# Patient Record
Sex: Female | Born: 1950 | Hispanic: No | Marital: Single | Smoking: Never smoker
Health system: Southern US, Community
[De-identification: ages and names within clinical notes are randomized; demographics above are authoritative.]

## PROBLEM LIST (undated history)

## (undated) DIAGNOSIS — E039 Hypothyroidism, unspecified: Secondary | ICD-10-CM

## (undated) DIAGNOSIS — M1990 Unspecified osteoarthritis, unspecified site: Secondary | ICD-10-CM

## (undated) DIAGNOSIS — R519 Headache, unspecified: Secondary | ICD-10-CM

## (undated) HISTORY — PX: DIAGNOSTIC LAPAROSCOPY: SUR761

## (undated) HISTORY — PX: ABDOMINAL HYSTERECTOMY: SHX81

---

## 2018-11-28 ENCOUNTER — Other Ambulatory Visit: Payer: Self-pay | Admitting: Podiatry

## 2018-12-20 NOTE — Patient Instructions (Signed)
Angela Mckay  12/20/2018     @PREFPERIOPPHARMACY @   Your procedure is scheduled on  12/27/2018  Report to [facility] at  0700   A.M.  Call this number if you have problems the morning of surgery:  [phone]   Remember:  Do not eat or drink after midnight.                       Take these medicines the morning of surgery with A SIP OF WATER  Euthyrox, imitrex(if needed).    Do not wear jewelry, make-up or nail polish.  Do not wear lotions, powders, or perfumes. Please wear deodorant and brush your teeth.  Do not shave 48 hours prior to surgery.  Men may shave face and neck.  Do not bring valuables to the hospital.  [facility] is not responsible for any belongings or valuables.  Contacts, dentures or bridgework may not be worn into surgery.  Leave your suitcase in the car.  After surgery it may be brought to your room.  For patients admitted to the hospital, discharge time will be determined by your treatment team.  Patients discharged the day of surgery will not be allowed to drive home.   Name and phone number of your driver:   family Special instructions:  None  Please read over the following fact sheets that you were given. Anesthesia Post-op Instructions and Care and Recovery After Surgery       Ostectomy of the Foot, Care After This sheet gives you information about how to care for yourself after your procedure. Your health care provider may also give you more specific instructions. If you have problems or questions, contact your health care provider. What can I expect after the procedure? After the procedure, it is common to have:  Pain.  Swelling.  Stiffness. Follow these instructions at home: If you have a splint or supportive shoe:  Wear the splint or shoe as told by your health care provider. Remove it only as told by your health care provider.  Loosen the splint or shoe if your toes tingle, become numb, or turn cold and blue.  Keep the  splint or shoe clean.  If the splint or shoe is not waterproof: ? Do not let it get wet. ? Cover it with a watertight covering when you take a bath or a shower. If you have a cast:  Do not stick anything inside the cast to scratch your skin. Doing that increases your risk of infection.  Check the skin around the cast every day. Tell your health care provider about any concerns.  You may put lotion on dry skin around the edges of the cast. Do not put lotion on the skin underneath the cast.  Keep the cast clean.  If the cast is not waterproof: ? Do not let it get wet. ? Cover it with a watertight covering when you take a bath or a shower. Bathing  Do not take baths, swim, or use a hot tub until your health care provider approves. Ask your health care provider if you may take showers. You may only be allowed to take sponge baths for bathing.  If your cast or splint is not waterproof, cover it with a watertight covering when you take a bath or a shower.  Keep the bandage (dressing) dry until your health care provider says it can be removed. Incision care   Follow instructions from your  health care provider about how to take care of your incision. Make sure you: ? Wash your hands with soap and water before you change your bandage (dressing). If soap and water are not available, use hand sanitizer. ? Change your dressing as told by your health care provider. ? Leave stitches (sutures), skin glue, or adhesive strips in place. These skin closures may need to stay in place for 2 weeks or longer. If adhesive strip edges start to loosen and curl up, you may trim the loose edges. Do not remove adhesive strips completely unless your health care provider tells you to do that.  Check your incision area every day for signs of infection. Check for: ? Redness, swelling, or pain. ? Fluid or blood. ? Warmth. ? Pus or a bad smell. Managing pain, stiffness, and swelling   If directed, put ice on  the affected area. ? If you have a removable splint or shoe, remove it as told by your health care provider. ? Put ice in a plastic bag. ? Place a towel between your skin and the bag or between your cast and the bag. ? Leave the ice on for 20 minutes, 2-3 times a day.  Move your foot and toes often to avoid stiffness and to lessen swelling.  Raise (elevate) your foot area above the level of your heart while you are sitting or lying down. Driving  Do not drive or use heavy machinery while taking prescription pain medicine.  Ask your health care provider when it is safe to drive if you have a cast, splint, or supportive shoe on your foot. Activity  Return to your normal activities as told by your health care provider. Ask your health care provider what activities are safe for you.  Do exercises as told by your health care provider. Safety  Do not use your foot to support your body weight until your health care provider says that you can.  Use your crutches or walker as told by your health care provider. General instructions  Do not put pressure on any part of the cast or splint until it is fully hardened. This may take several hours.  Do not use any products that contain nicotine or tobacco, such as cigarettes and e-cigarettes. These can delay bone healing. If you need help quitting, ask your health care provider.  Take over-the-counter and prescription medicines only as told by your health care provider.  Keep all follow-up visits as told by your health care provider. This is important. Contact a health care provider if:  You have chills or a fever.  Your pain is not controlled by your pain medicine.  You have redness, swelling, or pain around your incision.  You have fluid or blood coming from your incision.  Your incision feels warm to the touch.  You have pus or a bad smell coming from your incision. Get help right away if:  You have severe pain.  You have new  pain, warmth, and swelling in your leg.  You have chest pain or difficulty breathing. Summary  After the procedure, it is common to have some pain, swelling, and stiffness.  Follow instructions from your health care provider about how to take care of your incision. Check the incision area every day for signs of infection.  Do not use your foot to support your body weight until your health care provider says that you can.  Move your foot and toes often to avoid stiffness and to lessen swelling. This  information is not intended to replace advice given to you by your health care provider. Make sure you discuss any questions you have with your health care provider. Document Released: 06/08/2016 Document Revised: 06/15/2018 Document Reviewed: 06/08/2016 Elsevier Patient Education  2020 [employer] How to Use Chlorhexidine for Bathing Chlorhexidine gluconate (CHG) is a germ-killing (antiseptic) solution that is used to clean the skin. It can get rid of the bacteria that normally live on the skin and can keep them away for about 24 hours. To clean your skin with CHG, you may be given:  A CHG solution to use in the shower or as part of a sponge bath.  A prepackaged cloth that contains CHG. Cleaning your skin with CHG may help lower the risk for infection:  While you are staying in the intensive care unit of the hospital.  If you have a vascular access, such as a central line, to provide short-term or long-term access to your veins.  If you have a catheter to drain urine from your bladder.  If you are on a ventilator. A ventilator is a machine that helps you breathe by moving air in and out of your lungs.  After surgery. What are the risks? Risks of using CHG include:  A skin reaction.  Hearing loss, if CHG gets in your ears.  Eye injury, if CHG gets in your eyes and is not rinsed out.  The CHG product catching fire. Make sure that you avoid smoking and flames after applying CHG  to your skin. Do not use CHG:  If you have a chlorhexidine allergy or have previously reacted to chlorhexidine.  On babies younger than 892 months of age. How to use CHG solution  Use CHG only as told by your health care provider, and follow the instructions on the label.  Use the full amount of CHG as directed. Usually, this is one bottle. During a shower Follow these steps when using CHG solution during a shower (unless your health care provider gives you different instructions): 1. Start the shower. 2. Use your normal soap and shampoo to wash your face and hair. 3. Turn off the shower or move out of the shower stream. 4. Pour the CHG onto a clean washcloth. Do not use any type of brush or rough-edged sponge. 5. Starting at your neck, lather your body down to your toes. Make sure you follow these instructions: ? If you will be having surgery, pay special attention to the part of your body where you will be having surgery. Scrub this area for at least 1 minute. ? Do not use CHG on your head or face. If the solution gets into your ears or eyes, rinse them well with water. ? Avoid your genital area. ? Avoid any areas of skin that have broken skin, cuts, or scrapes. ? Scrub your back and under your arms. Make sure to wash skin folds. 6. Let the lather sit on your skin for 1-2 minutes or as long as told by your health care provider. 7. Thoroughly rinse your entire body in the shower. Make sure that all body creases and crevices are rinsed well. 8. Dry off with a clean towel. Do not put any substances on your body afterward-such as powder, lotion, or perfume-unless you are told to do so by your health care provider. Only use lotions that are recommended by the manufacturer. 9. Put on clean clothes or pajamas. 10. If it is the night before your surgery, sleep in clean  sheets.  During a sponge bath Follow these steps when using CHG solution during a sponge bath (unless your health care provider  gives you different instructions): 1. Use your normal soap and shampoo to wash your face and hair. 2. Pour the CHG onto a clean washcloth. 3. Starting at your neck, lather your body down to your toes. Make sure you follow these instructions: ? If you will be having surgery, pay special attention to the part of your body where you will be having surgery. Scrub this area for at least 1 minute. ? Do not use CHG on your head or face. If the solution gets into your ears or eyes, rinse them well with water. ? Avoid your genital area. ? Avoid any areas of skin that have broken skin, cuts, or scrapes. ? Scrub your back and under your arms. Make sure to wash skin folds. 4. Let the lather sit on your skin for 1-2 minutes or as long as told by your health care provider. 5. Using a different clean, wet washcloth, thoroughly rinse your entire body. Make sure that all body creases and crevices are rinsed well. 6. Dry off with a clean towel. Do not put any substances on your body afterward-such as powder, lotion, or perfume-unless you are told to do so by your health care provider. Only use lotions that are recommended by the manufacturer. 7. Put on clean clothes or pajamas. 8. If it is the night before your surgery, sleep in clean sheets. How to use CHG prepackaged cloths  Only use CHG cloths as told by your health care provider, and follow the instructions on the label.  Use the CHG cloth on clean, dry skin.  Do not use the CHG cloth on your head or face unless your health care provider tells you to.  When washing with the CHG cloth: ? Avoid your genital area. ? Avoid any areas of skin that have broken skin, cuts, or scrapes. Before surgery Follow these steps when using a CHG cloth to clean before surgery (unless your health care provider gives you different instructions): 1. Using the CHG cloth, vigorously scrub the part of your body where you will be having surgery. Scrub using a back-and-forth  motion for 3 minutes. The area on your body should be completely wet with CHG when you are done scrubbing. 2. Do not rinse. Discard the cloth and let the area air-dry. Do not put any substances on the area afterward, such as powder, lotion, or perfume. 3. Put on clean clothes or pajamas. 4. If it is the night before your surgery, sleep in clean sheets.  For general bathing Follow these steps when using CHG cloths for general bathing (unless your health care provider gives you different instructions). 1. Use a separate CHG cloth for each area of your body. Make sure you wash between any folds of skin and between your fingers and toes. Wash your body in the following order, switching to a new cloth after each step: ? The front of your neck, shoulders, and chest. ? Both of your arms, under your arms, and your hands. ? Your stomach and groin area, avoiding the genitals. ? Your right leg and foot. ? Your left leg and foot. ? The back of your neck, your back, and your buttocks. 2. Do not rinse. Discard the cloth and let the area air-dry. Do not put any substances on your body afterward-such as powder, lotion, or perfume-unless you are told to do so by your  health care provider. Only use lotions that are recommended by the manufacturer. 3. Put on clean clothes or pajamas. Contact a health care provider if:  Your skin gets irritated after scrubbing.  You have questions about using your solution or cloth. Get help right away if:  Your eyes become very red or swollen.  Your eyes itch badly.  Your skin itches badly and is red or swollen.  Your hearing changes.  You have trouble seeing.  You have swelling or tingling in your mouth or throat.  You have trouble breathing.  You swallow any chlorhexidine. Summary  Chlorhexidine gluconate (CHG) is a germ-killing (antiseptic) solution that is used to clean the skin. Cleaning your skin with CHG may help to lower your risk for infection.  You  may be given CHG to use for bathing. It may be in a bottle or in a prepackaged cloth to use on your skin. Carefully follow your health care provider's instructions and the instructions on the product label.  Do not use CHG if you have a chlorhexidine allergy.  Contact your health care provider if your skin gets irritated after scrubbing. This information is not intended to replace advice given to you by your health care provider. Make sure you discuss any questions you have with your health care provider. Document Released: 01/12/2012 Document Revised: 07/06/2018 Document Reviewed: 03/17/2017 Elsevier Patient Education  2020 [employer]

## 2018-12-25 ENCOUNTER — Ambulatory Visit (HOSPITAL_COMMUNITY)
Admission: RE | Admit: 2018-12-25 | Discharge: 2018-12-25 | Disposition: A | Discharge status: Home / Self Care | Payer: Medicare Other | Source: Ambulatory Visit | Attending: Podiatry | Admitting: Podiatry

## 2018-12-25 ENCOUNTER — Encounter (HOSPITAL_COMMUNITY)
Admission: RE | Admit: 2018-12-25 | Discharge: 2018-12-25 | Disposition: A | Discharge status: Home / Self Care | Payer: Medicare Other | Source: Ambulatory Visit | Attending: Podiatry | Admitting: Podiatry

## 2018-12-25 ENCOUNTER — Other Ambulatory Visit: Payer: Self-pay

## 2018-12-25 ENCOUNTER — Other Ambulatory Visit (HOSPITAL_COMMUNITY)
Admission: RE | Admit: 2018-12-25 | Discharge: 2018-12-25 | Disposition: A | Discharge status: Home / Self Care | Payer: Medicare Other | Source: Ambulatory Visit | Attending: Podiatry | Admitting: Podiatry

## 2018-12-25 ENCOUNTER — Encounter (HOSPITAL_COMMUNITY): Payer: Self-pay

## 2018-12-25 DIAGNOSIS — M898X7 Other specified disorders of bone, ankle and foot: Secondary | ICD-10-CM | POA: Insufficient documentation

## 2018-12-25 DIAGNOSIS — Z01818 Encounter for other preprocedural examination: Secondary | ICD-10-CM | POA: Insufficient documentation

## 2018-12-25 DIAGNOSIS — Z20828 Contact with and (suspected) exposure to other viral communicable diseases: Secondary | ICD-10-CM | POA: Insufficient documentation

## 2018-12-25 HISTORY — DX: Unspecified osteoarthritis, unspecified site: M19.90

## 2018-12-25 HISTORY — DX: Headache, unspecified: R51.9

## 2018-12-25 HISTORY — DX: Hypothyroidism, unspecified: E03.9

## 2018-12-25 LAB — SARS CORONAVIRUS 2 (TAT 6-24 HRS): SARS Coronavirus 2: NEGATIVE

## 2018-12-27 ENCOUNTER — Other Ambulatory Visit: Payer: Self-pay

## 2018-12-27 ENCOUNTER — Ambulatory Visit (HOSPITAL_COMMUNITY): Payer: Medicare Other | Admitting: Anesthesiology

## 2018-12-27 ENCOUNTER — Ambulatory Visit (HOSPITAL_COMMUNITY): Payer: Medicare Other

## 2018-12-27 ENCOUNTER — Encounter (HOSPITAL_COMMUNITY)
Admission: RE | Disposition: A | Discharge status: Home / Self Care | Payer: Self-pay | Source: Home / Self Care | Attending: Podiatry

## 2018-12-27 ENCOUNTER — Ambulatory Visit (HOSPITAL_COMMUNITY)
Admission: RE | Admit: 2018-12-27 | Discharge: 2018-12-27 | Disposition: A | Discharge status: Home / Self Care | Payer: Medicare Other | Attending: Podiatry | Admitting: Podiatry

## 2018-12-27 ENCOUNTER — Encounter (HOSPITAL_COMMUNITY): Payer: Self-pay | Admitting: Anesthesiology

## 2018-12-27 DIAGNOSIS — E039 Hypothyroidism, unspecified: Secondary | ICD-10-CM | POA: Diagnosis not present

## 2018-12-27 DIAGNOSIS — M357 Hypermobility syndrome: Secondary | ICD-10-CM | POA: Diagnosis not present

## 2018-12-27 DIAGNOSIS — Z79899 Other long term (current) drug therapy: Secondary | ICD-10-CM | POA: Diagnosis not present

## 2018-12-27 DIAGNOSIS — Z79890 Hormone replacement therapy (postmenopausal): Secondary | ICD-10-CM | POA: Insufficient documentation

## 2018-12-27 DIAGNOSIS — M898X7 Other specified disorders of bone, ankle and foot: Secondary | ICD-10-CM | POA: Diagnosis present

## 2018-12-27 DIAGNOSIS — Z9889 Other specified postprocedural states: Secondary | ICD-10-CM

## 2018-12-27 HISTORY — PX: BONE EXOSTOSIS EXCISION: SHX1249

## 2018-12-27 SURGERY — EXCISION, EXOSTOSIS
Anesthesia: Monitor Anesthesia Care | Site: Foot | Laterality: Right

## 2018-12-27 MED ORDER — LIDOCAINE HCL (CARDIAC) PF 100 MG/5ML IV SOSY
PREFILLED_SYRINGE | INTRAVENOUS | Status: DC | PRN
  Administered 2018-12-27: 20 mg via INTRAVENOUS

## 2018-12-27 MED ORDER — ONDANSETRON HCL 4 MG/2ML IJ SOLN
INTRAMUSCULAR | Status: AC
  Filled 2018-12-27: qty 2

## 2018-12-27 MED ORDER — PROPOFOL 500 MG/50ML IV EMUL
INTRAVENOUS | Status: DC | PRN
  Administered 2018-12-27: 150 ug/kg/min via INTRAVENOUS

## 2018-12-27 MED ORDER — LACTATED RINGERS IV SOLN
Freq: Once | INTRAVENOUS | Status: AC
  Administered 2018-12-27: 08:00:00 via INTRAVENOUS

## 2018-12-27 MED ORDER — CEFAZOLIN SODIUM-DEXTROSE 2-4 GM/100ML-% IV SOLN
2.0000 g | INTRAVENOUS | Status: AC
  Administered 2018-12-27: 2 g via INTRAVENOUS
  Filled 2018-12-27: qty 100

## 2018-12-27 MED ORDER — ONDANSETRON HCL 4 MG/2ML IJ SOLN
INTRAMUSCULAR | Status: DC | PRN
  Administered 2018-12-27: 4 mg via INTRAVENOUS

## 2018-12-27 MED ORDER — MIDAZOLAM HCL 2 MG/2ML IJ SOLN
INTRAMUSCULAR | Status: AC
  Filled 2018-12-27: qty 2

## 2018-12-27 MED ORDER — 0.9 % SODIUM CHLORIDE (POUR BTL) OPTIME
TOPICAL | Status: DC | PRN
  Administered 2018-12-27: 500 mL

## 2018-12-27 MED ORDER — PROPOFOL 10 MG/ML IV BOLUS
INTRAVENOUS | Status: AC
  Filled 2018-12-27: qty 40

## 2018-12-27 MED ORDER — CHLORHEXIDINE GLUCONATE CLOTH 2 % EX PADS: 6.0000 | MEDICATED_PAD | Freq: Once | CUTANEOUS | Status: DC

## 2018-12-27 MED ORDER — DEXAMETHASONE SODIUM PHOSPHATE 10 MG/ML IJ SOLN
INTRAMUSCULAR | Status: AC
  Filled 2018-12-27: qty 1

## 2018-12-27 MED ORDER — LIDOCAINE HCL 1 % IJ SOLN
INTRAMUSCULAR | Status: DC | PRN
  Administered 2018-12-27: 10 mL via INTRAMUSCULAR

## 2018-12-27 MED ORDER — KETAMINE HCL 50 MG/5ML IJ SOSY
PREFILLED_SYRINGE | INTRAMUSCULAR | Status: AC
  Filled 2018-12-27: qty 5

## 2018-12-27 MED ORDER — ONDANSETRON HCL 4 MG/2ML IJ SOLN: 4.0000 mg | Freq: Once | INTRAMUSCULAR | Status: DC | PRN

## 2018-12-27 MED ORDER — PROPOFOL 10 MG/ML IV BOLUS
INTRAVENOUS | Status: DC | PRN
  Administered 2018-12-27: 20 mg via INTRAVENOUS

## 2018-12-27 MED ORDER — BUPIVACAINE HCL (PF) 0.5 % IJ SOLN
INTRAMUSCULAR | Status: DC | PRN
  Administered 2018-12-27: 10 mL

## 2018-12-27 MED ORDER — LIDOCAINE HCL (PF) 1 % IJ SOLN
INTRAMUSCULAR | Status: AC
  Filled 2018-12-27: qty 30

## 2018-12-27 MED ORDER — KETAMINE HCL 10 MG/ML IJ SOLN
INTRAMUSCULAR | Status: DC | PRN
  Administered 2018-12-27: 10 mg via INTRAVENOUS
  Administered 2018-12-27: 5 mg via INTRAVENOUS

## 2018-12-27 MED ORDER — MEPERIDINE HCL 50 MG/ML IJ SOLN: 6.2500 mg | INTRAMUSCULAR | Status: DC | PRN

## 2018-12-27 MED ORDER — FENTANYL CITRATE (PF) 100 MCG/2ML IJ SOLN: 25.0000 ug | INTRAMUSCULAR | Status: DC | PRN

## 2018-12-27 MED ORDER — BUPIVACAINE HCL (PF) 0.5 % IJ SOLN
INTRAMUSCULAR | Status: AC
  Filled 2018-12-27: qty 30

## 2018-12-27 SURGICAL SUPPLY — 41 items
BANDAGE ESMARK 4X12 BL STRL LF (DISPOSABLE) ×1 IMPLANT
BENZOIN TINCTURE PRP APPL 2/3 (GAUZE/BANDAGES/DRESSINGS) ×3 IMPLANT
BLADE AVERAGE 25MMX9MM (BLADE) ×1
BLADE AVERAGE 25X9 (BLADE) ×1 IMPLANT
BLADE SURG 15 STRL LF DISP TIS (BLADE) ×1 IMPLANT
BLADE SURG 15 STRL SS (BLADE) ×2
BNDG CONFORM 2 STRL LF (GAUZE/BANDAGES/DRESSINGS) ×3 IMPLANT
BNDG ELASTIC 4X5.8 VLCR NS LF (GAUZE/BANDAGES/DRESSINGS) ×3 IMPLANT
BNDG ESMARK 4X12 BLUE STRL LF (DISPOSABLE) ×3
BNDG GAUZE ELAST 4 BULKY (GAUZE/BANDAGES/DRESSINGS) ×3 IMPLANT
CHLORAPREP W/TINT 26 (MISCELLANEOUS) ×3 IMPLANT
CLOSURE WOUND 1/2 X4 (GAUZE/BANDAGES/DRESSINGS) ×1
CLOTH BEACON ORANGE TIMEOUT ST (SAFETY) ×3 IMPLANT
COVER LIGHT HANDLE STERIS (MISCELLANEOUS) ×6 IMPLANT
COVER WAND RF STERILE (DRAPES) ×2 IMPLANT
CUFF TOURN SGL QUICK 18X4 (TOURNIQUET CUFF) ×2 IMPLANT
DRAPE OEC MINIVIEW 54X84 (DRAPES) ×2 IMPLANT
DRSG ADAPTIC 3X8 NADH LF (GAUZE/BANDAGES/DRESSINGS) ×3 IMPLANT
ELECT REM PT RETURN 9FT ADLT (ELECTROSURGICAL) ×3
ELECTRODE REM PT RTRN 9FT ADLT (ELECTROSURGICAL) ×1 IMPLANT
GAUZE SPONGE 4X4 12PLY STRL (GAUZE/BANDAGES/DRESSINGS) ×3 IMPLANT
GLOVE BIO SURGEON STRL SZ7.5 (GLOVE) ×3 IMPLANT
GLOVE BIOGEL PI IND STRL 7.0 (GLOVE) ×1 IMPLANT
GLOVE BIOGEL PI IND STRL 7.5 (GLOVE) ×2 IMPLANT
GLOVE BIOGEL PI INDICATOR 7.0 (GLOVE) ×2
GLOVE BIOGEL PI INDICATOR 7.5 (GLOVE) ×2
GLOVE ECLIPSE 7.0 STRL STRAW (GLOVE) ×6 IMPLANT
GOWN STRL REUS W/TWL LRG LVL3 (GOWN DISPOSABLE) ×7 IMPLANT
KIT TURNOVER KIT A (KITS) ×3 IMPLANT
MANIFOLD NEPTUNE II (INSTRUMENTS) ×3 IMPLANT
NDL HYPO 25X1 1.5 SAFETY (NEEDLE) ×2 IMPLANT
NEEDLE HYPO 25X1 1.5 SAFETY (NEEDLE) ×9 IMPLANT
NS IRRIG 1000ML POUR BTL (IV SOLUTION) ×3 IMPLANT
PACK BASIC LIMB (CUSTOM PROCEDURE TRAY) ×3 IMPLANT
PAD ARMBOARD 7.5X6 YLW CONV (MISCELLANEOUS) ×3 IMPLANT
RASP SM TEAR CROSS CUT (RASP) ×3 IMPLANT
SET BASIN LINEN APH (SET/KITS/TRAYS/PACK) ×3 IMPLANT
STRIP CLOSURE SKIN 1/2X4 (GAUZE/BANDAGES/DRESSINGS) ×3 IMPLANT
SUT PROLENE 4 0 PS 2 18 (SUTURE) ×2 IMPLANT
SUT VIC AB 4-0 PS2 27 (SUTURE) ×3 IMPLANT
SYR CONTROL 10ML LL (SYRINGE) ×4 IMPLANT

## 2018-12-27 NOTE — Anesthesia Procedure Notes (Signed)
Procedure Name: MAC Date/Time: 12/27/2018 8:25 AM Performed by: Andree Elk Amy A, CRNA Pre-anesthesia Checklist: Patient identified, Emergency Drugs available, Suction available, Timeout performed and Patient being monitored Patient Re-evaluated:Patient Re-evaluated prior to induction Oxygen Delivery Method: Non-rebreather mask

## 2018-12-27 NOTE — Anesthesia Postprocedure Evaluation (Signed)
Anesthesia Post Note  Patient: Angela Mckay  Procedure(s) Performed: REMOVAL EXOSTOSIS RIGHT FOOT (Right Foot)  Patient location during evaluation: PACU Anesthesia Type: MAC Level of consciousness: awake and alert and oriented Pain management: pain level controlled Vital Signs Assessment: post-procedure vital signs reviewed and stable Respiratory status: spontaneous breathing Cardiovascular status: stable Postop Assessment: no apparent nausea or vomiting Anesthetic complications: no     Last Vitals:  Vitals:   12/27/18 0745  BP: (!) 157/91  Temp: 36.9 C  SpO2: 99%    Last Pain:  Vitals:   12/27/18 0745  PainSc: 0-No pain                 [name] A

## 2018-12-27 NOTE — Brief Op Note (Signed)
12/27/2018  9:19 AM  PATIENT:  Angela Mckay  68 y.o. female  PRE-OPERATIVE DIAGNOSIS:  exostosis right foot  POST-OPERATIVE DIAGNOSIS:  exostosis right foot  PROCEDURE:  Procedure(s): REMOVAL EXOSTOSIS RIGHT FOOT (Right)  SURGEON:  Surgeon(s) and Role:    Posey Pronto, Rocket Gunderson, DPM - Primary    ASSISTANTS: none   ANESTHESIA:   local and MAC  EBL:  5 mL   BLOOD ADMINISTERED:none  DRAINS: none   LOCAL MEDICATIONS USED:  MARCAINE   , LIDOCAINE  and Amount: 10 ml pre op  SPECIMEN:  No Specimen  DISPOSITION OF SPECIMEN:  N/A  COUNTS:  YES  TOURNIQUET:   Total Tourniquet Time Documented: Calf (Right) - 29 minutes Total: Calf (Right) - 29 minutes   DICTATION: .Viviann Spare Dictation  PLAN OF CARE: Discharge to home after PACU  PATIENT DISPOSITION:  PACU - hemodynamically stable.   Delay start of Pharmacological VTE agent (>24hrs) due to surgical blood loss or risk of bleeding: not applicable

## 2018-12-27 NOTE — Op Note (Signed)
12/27/2018  9:19 AM  PATIENT:  Angela Mckay  68 y.o. female  PRE-OPERATIVE DIAGNOSIS:  exostosis right foot  POST-OPERATIVE DIAGNOSIS:  exostosis right foot  PROCEDURE:  Procedure(s): REMOVAL EXOSTOSIS RIGHT FOOT (Right)  SURGEON:  Surgeon(s) and Role:    * Miriya Cloer, Dance movement psychotherapist, DPM - Primary   ASSISTANTS: none   ANESTHESIA:   local and MAC  EBL:  5 mL   LOCAL MEDICATIONS USED:  MARCAINE   , LIDOCAINE  and Amount: 10 ml pre op  SPECIMEN:  No Specimen   TOURNIQUET:   Total Tourniquet Time Documented: Calf (Right) - 29 minutes Total: Calf (Right) - 29 minutes  Patient was brought into the operating room laid supine on the operating table. Ankle tourniquet was applied to the surgical extremity. Following IV sedation, a local block was achieved using 10 cc of mixture of 1% plain lidocaine with 0.5% marcaine. The foot was the prepped, scrubbed and draped in aseptic manner. Using an esmarch band the tourniquet on the surgical site was inflatted at 249mHG.   Attention was directed toward the dorsal aspect of the right foot where a bony prominence noted at the base of the first met cuneiform joint. A 5cm dorsal linear incision was made over the bump down to skin and subcutaneous tissue. All the bleeders were ligated. Neurovascular bundle was retrated. Deep fascia was visible at this time and was incised to access the bony bump. A sagittal saw was used to remove the exostosis. Power was used to rasp down any sharp edges and more bone was shaved down from the cuneiform and 1st met base. Wound was irritated with normal saline. Deep fascia was closed using 4-0 Vicryl. Subcutaneous layer was closed using 4-0 vicryl. Skin was closed using 4-0 Prolene. Additional 170mof plain 0.5% marcaine was injected in surgical site. Dry sterile dressing was applied. Tourniquet was deflated and capillary refill time was brisk. Patient will be partial weightbearing with surgical shoe.

## 2018-12-27 NOTE — Transfer of Care (Signed)
Immediate Anesthesia Transfer of Care Note  Patient: Angela Mckay  Procedure(s) Performed: REMOVAL EXOSTOSIS RIGHT FOOT (Right Foot)  Patient Location: PACU  Anesthesia Type:MAC  Level of Consciousness: awake, alert , oriented and patient cooperative  Airway & Oxygen Therapy: Patient Spontanous Breathing  Post-op Assessment: Report given to RN and Post -op Vital signs reviewed and stable  Post vital signs: Reviewed and stable  Last Vitals:  Vitals Value Taken Time  BP    Temp    Pulse    Resp    SpO2      Last Pain:  Vitals:   12/27/18 0745  PainSc: 0-No pain         Complications: No apparent anesthesia complications

## 2018-12-27 NOTE — H&P (Signed)
.   HISTORY AND PHYSICAL INTERVAL NOTE:  12/27/2018  8:10 AM  Angela Mckay  has presented today for surgery, with the diagnosis of exostosis right foot.  The various methods of treatment have been discussed with the patient.  No guarantees were given.  After consideration of risks, benefits and other options for treatment, the patient has consented to surgery.  I have reviewed the patients' chart and labs.    Patient Vitals for the past 24 hrs:  BP Temp SpO2  12/27/18 0745 (!) 157/91 98.4 F (36.9 C) 99 %    A history and physical examination was performed in my office.  The patient was reexamined.  There have been no changes to this history and physical examination.  Tyson Babinski, DPM

## 2018-12-27 NOTE — Discharge Instructions (Signed)
General Anesthesia, Adult, Care After °This sheet gives you information about how to care for yourself after your procedure. Your health care provider may also give you more specific instructions. If you have problems or questions, contact your health care provider. °What can I expect after the procedure? °After the procedure, the following side effects are common: °· Pain or discomfort at the IV site. °· Nausea. °· Vomiting. °· Sore throat. °· Trouble concentrating. °· Feeling cold or chills. °· Weak or tired. °· Sleepiness and fatigue. °· Soreness and body aches. These side effects can affect parts of the body that were not involved in surgery. °Follow these instructions at home: ° °For at least 24 hours after the procedure: °· Have a responsible adult stay with you. It is important to have someone help care for you until you are awake and alert. °· Rest as needed. °· Do not: °? Participate in activities in which you could fall or become injured. °? Drive. °? Use heavy machinery. °? Drink alcohol. °? Take sleeping pills or medicines that cause drowsiness. °? Make important decisions or sign legal documents. °? Take care of children on your own. °Eating and drinking °· Follow any instructions from your health care provider about eating or drinking restrictions. °· When you feel hungry, start by eating small amounts of foods that are soft and easy to digest (bland), such as toast. Gradually return to your regular diet. °· Drink enough fluid to keep your urine pale yellow. °· If you vomit, rehydrate by drinking water, juice, or clear broth. °General instructions °· If you have sleep apnea, surgery and certain medicines can increase your risk for breathing problems. Follow instructions from your health care provider about wearing your sleep device: °? Anytime you are sleeping, including during daytime naps. °? While taking prescription pain medicines, sleeping medicines, or medicines that make you drowsy. °· Return to  your normal activities as told by your health care provider. Ask your health care provider what activities are safe for you. °· Take over-the-counter and prescription medicines only as told by your health care provider. °· If you smoke, do not smoke without supervision. °· Keep all follow-up visits as told by your health care provider. This is important. °Contact a health care provider if: °· You have nausea or vomiting that does not get better with medicine. °· You cannot eat or drink without vomiting. °· You have pain that does not get better with medicine. °· You are unable to pass urine. °· You develop a skin rash. °· You have a fever. °· You have redness around your IV site that gets worse. °Get help right away if: °· You have difficulty breathing. °· You have chest pain. °· You have blood in your urine or stool, or you vomit blood. °Summary °· After the procedure, it is common to have a sore throat or nausea. It is also common to feel tired. °· Have a responsible adult stay with you for the first 24 hours after general anesthesia. It is important to have someone help care for you until you are awake and alert. °· When you feel hungry, start by eating small amounts of foods that are soft and easy to digest (bland), such as toast. Gradually return to your regular diet. °· Drink enough fluid to keep your urine pale yellow. °· Return to your normal activities as told by your health care provider. Ask your health care provider what activities are safe for you. °This information is not   intended to replace advice given to you by your health care provider. Make sure you discuss any questions you have with your health care provider. Document Released: 07/26/2000 Document Revised: 04/22/2017 Document Reviewed: 12/03/2016 Elsevier Patient Education  [place]. .These instructions will give you an idea of what to expect after surgery and how to manage issues that may arise before your first post op office  visit.  Pain Management Pain is best managed by staying ahead of it. If pain gets out of control, it is difficult to get it back under control. Local anesthesia that lasts 6-8 hours is used to numb the foot and decrease pain.  For the best pain control, take the pain medication every 4 hours for the first 2 days post op. On the third day pain medication can be taken as needed.   Post Op Nausea Nausea is common after surgery, so it is managed proactively.  If prescribed, use the prescribed nausea medication regularly for the first 2 days post op.  Bandages Do not worry if there is blood on the bandage. What looks like a lot of blood on the bandage is actually a small amount. Blood on the dressing spreads out as it is absorbed by the gauze, the same way a drop of water spreads out on a paper towel.  If the bandages feel wet or dry, stiff and uncomfortable, call the office during office hours and we will schedule a time for you to have the bandage changed.  Unless you are specifically told otherwise, we will do the first bandage change in the office.  Keep your bandage dry. If the bandage becomes wet or soiled, notify the office and we will schedule a time to change the bandage.  Activity It is best to spend most of the first 2 days after surgery lying down with the foot elevated above the level of your heart. You may put weight on your heel while wearing the surgical shoe.   You may only get up to go to the restroom.  Driving Do not drive until you are able to respond in an emergency (i.e. slam on the brakes). This usually occurs after the bone has healed - 6 to 8 weeks.  Call the Office If you have a fever over 101F.  If you have increasing pain after the initial post op pain has settled down.  If you have increasing redness, swelling, or drainage.  If you have any questions or concerns.

## 2018-12-27 NOTE — Anesthesia Preprocedure Evaluation (Signed)
Anesthesia Evaluation  Patient identified by MRN, date of birth, ID band Patient awake    Reviewed: Allergy & Precautions, NPO status , Patient's Chart, lab work & pertinent test results  History of Anesthesia Complications Negative for: history of anesthetic complications  Airway Mallampati: II  TM Distance: >3 FB Neck ROM: Full    Dental no notable dental hx.    Pulmonary    Pulmonary exam normal breath sounds clear to auscultation       Cardiovascular Exercise Tolerance: Good negative cardio ROS Normal cardiovascular exam     Neuro/Psych  Headaches, negative psych ROS   GI/Hepatic negative GI ROS,   Endo/Other  Hypothyroidism   Renal/GU      Musculoskeletal  (+) Arthritis ,   Abdominal   Peds  Hematology   Anesthesia Other Findings   Reproductive/Obstetrics                             Anesthesia Physical Anesthesia Plan  ASA: II  Anesthesia Plan: MAC   Post-op Pain Management:    Induction:   PONV Risk Score and Plan:   Airway Management Planned: Nasal Cannula, Natural Airway and Simple Face Mask  Additional Equipment:   Intra-op Plan:   Post-operative Plan:   Informed Consent: I have reviewed the patients History and Physical, chart, labs and discussed the procedure including the risks, benefits and alternatives for the proposed anesthesia with the patient or authorized representative who has indicated his/her understanding and acceptance.     Dental advisory given  Plan Discussed with: CRNA  Anesthesia Plan Comments:         Anesthesia Quick Evaluation

## 2018-12-29 ENCOUNTER — Encounter (HOSPITAL_COMMUNITY): Payer: Self-pay | Admitting: Podiatry

## 2019-12-26 IMAGING — DX RIGHT FOOT COMPLETE - 3+ VIEW
3 series · 3 of 3 positions shown · non-contrast
Comparison: No recent.

CLINICAL DATA: Exostosis.  Preoperative exam.

EXAM:
RIGHT FOOT COMPLETE - 3+ VIEW

[foot ap]
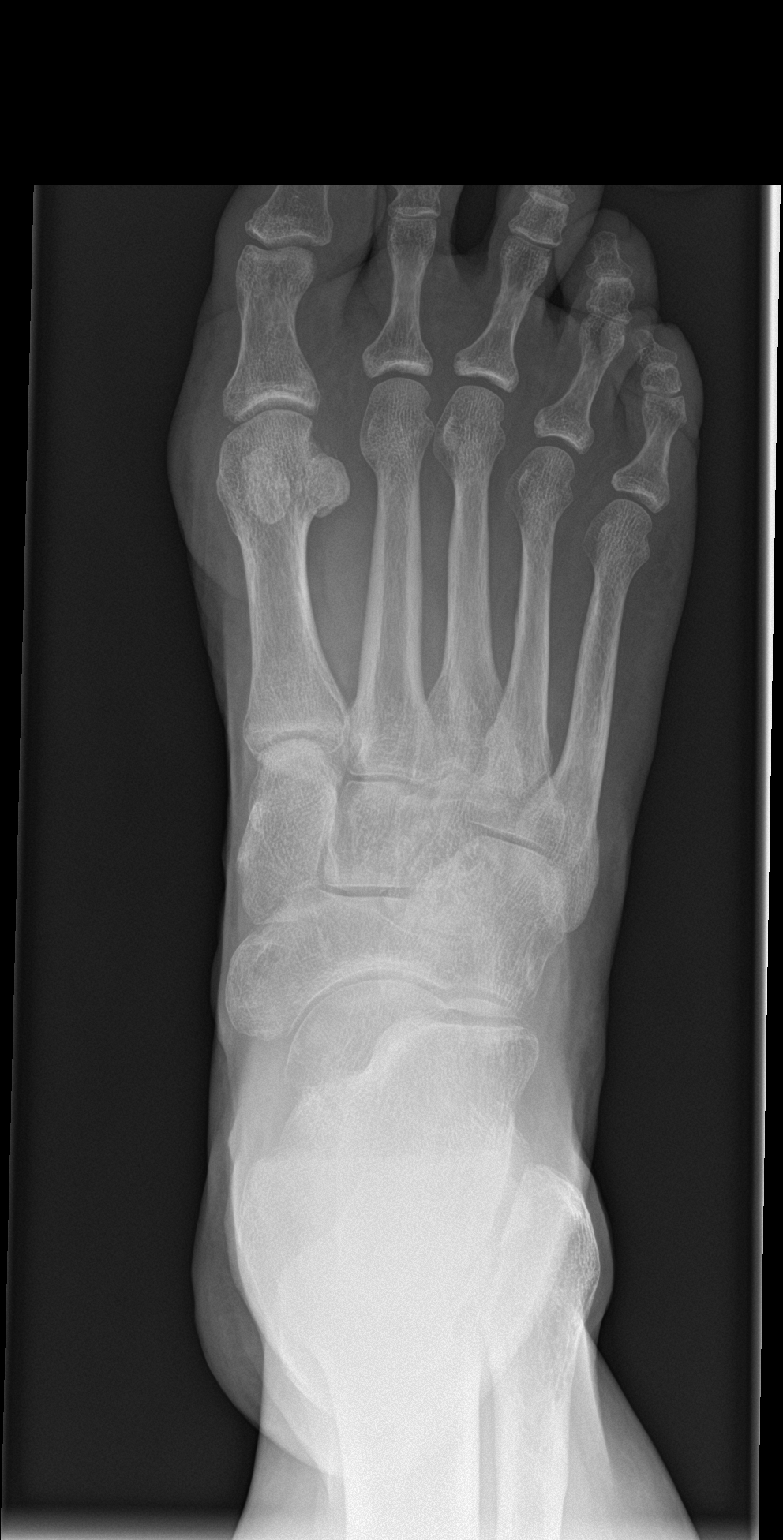

[foot obl]
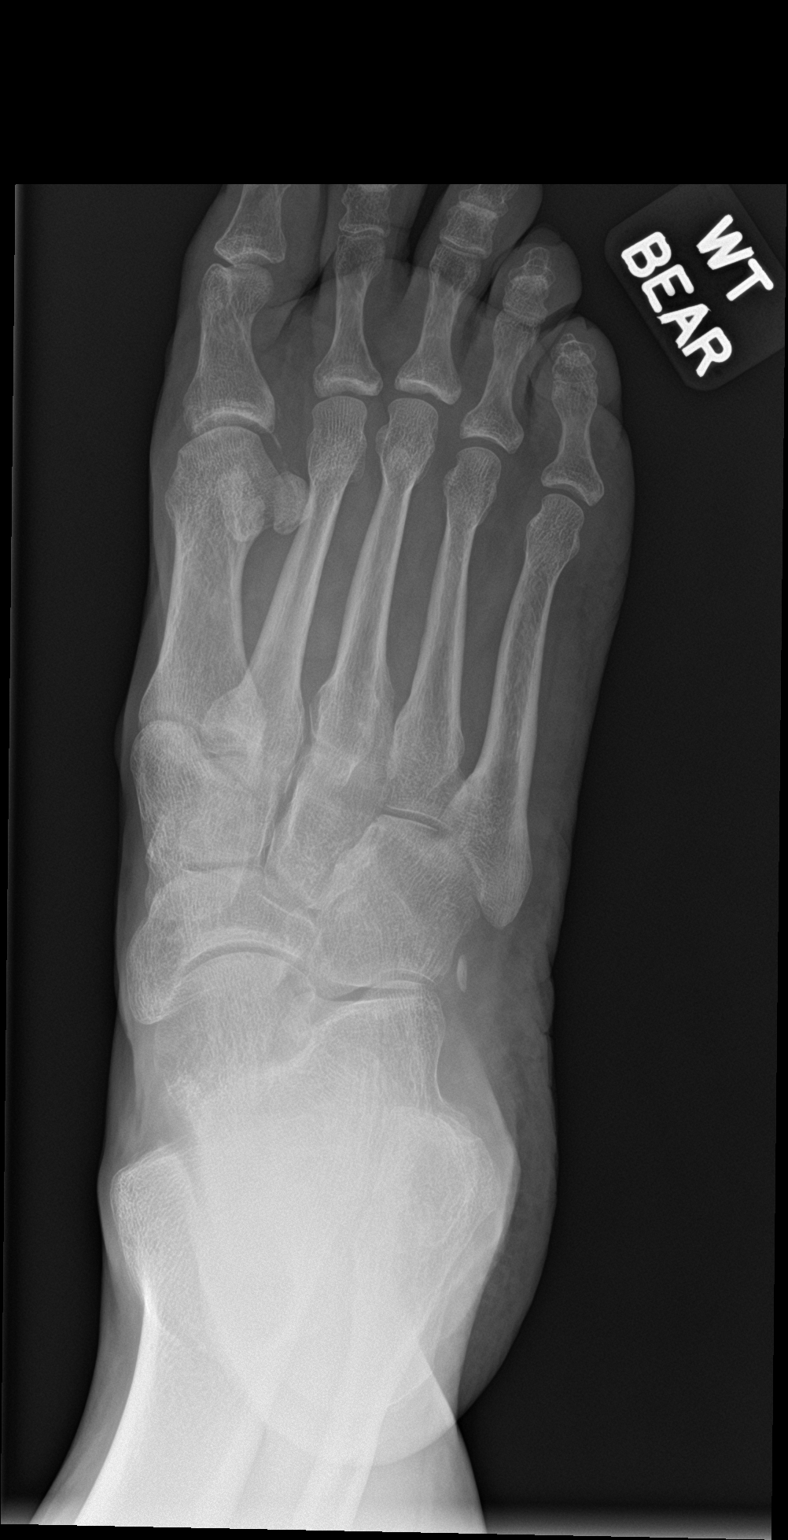

[foot lat]
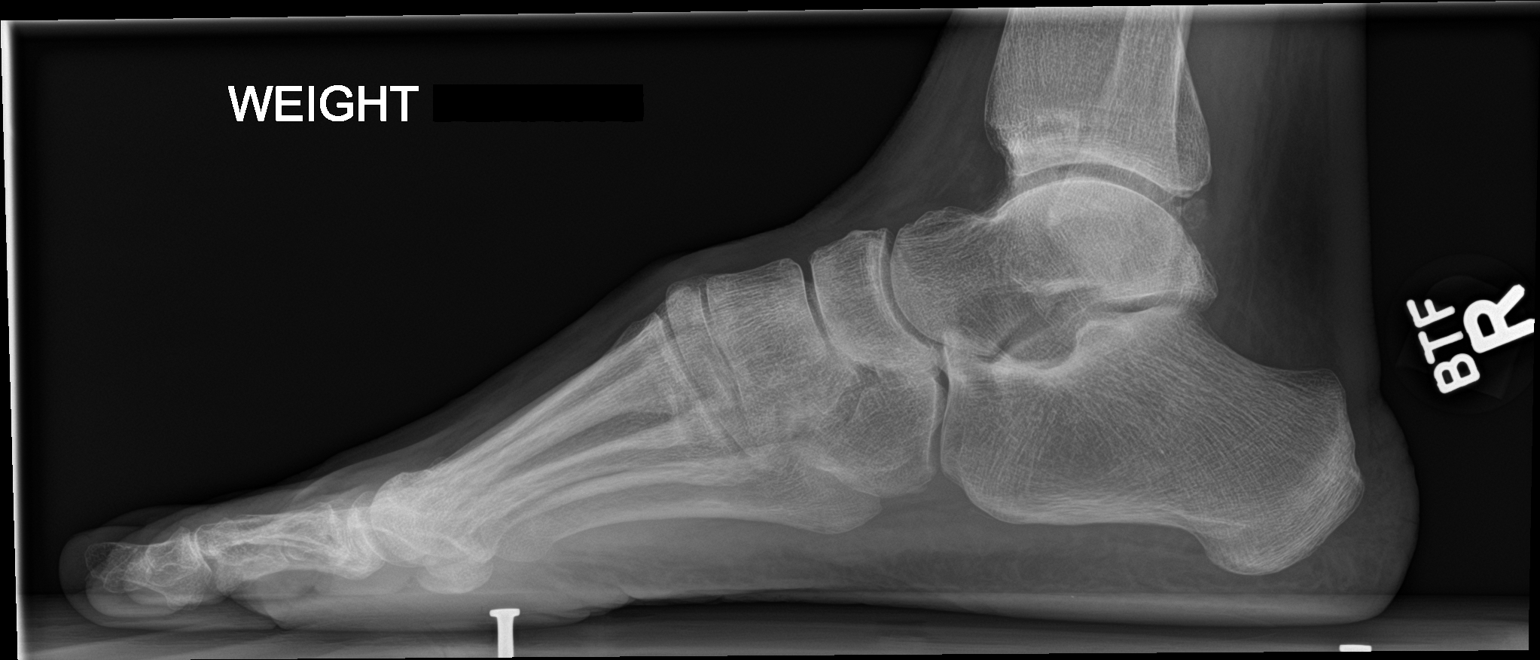

[3 of 3 positions shown; findings below may reference images not displayed]

FINDINGS: Diffuse mild degenerative change. Tiny bony density noted arising
from the lateral base of the right first proximal phalanx. No
evidence of fracture or dislocation. No radiopaque foreign body.
IMPRESSION: Diffuse mild degenerative change. Tiny bony density noted arising
from the lateral aspect of the base of the right first phalanx. No
acute bony abnormality.

## 2020-08-20 ENCOUNTER — Encounter: Payer: Self-pay | Admitting: Internal Medicine

## 2020-09-15 ENCOUNTER — Ambulatory Visit (INDEPENDENT_AMBULATORY_CARE_PROVIDER_SITE_OTHER): Payer: Medicare Other | Admitting: *Deleted

## 2020-09-15 VITALS — Ht 61.0 in | Wt 122.2 lb

## 2020-09-15 DIAGNOSIS — Z86010 Personal history of colonic polyps: Secondary | ICD-10-CM

## 2020-09-15 MED ORDER — PEG 3350-KCL-NA BICARB-NACL 420 G PO SOLR: 4000.0000 mL | Freq: Once | ORAL | 0 refills | Status: AC

## 2020-09-15 NOTE — Progress Notes (Addendum)
Gastroenterology Pre-Procedure Review  Request Date: 09/15/2020 Requesting Physician: Grayland Jack, NP @ [facility], Last TCS 09/25/2014 by Dr. Teena Dunk, tubular adenoma  PATIENT REVIEW QUESTIONS: The patient responded to the following health history questions as indicated:    1. Diabetes Melitis: no 2. Joint replacements in the past 12 months: no 3. Major health problems in the past 3 months: no 4. Has an artificial valve or MVP: no 5. Has a defibrillator: no 6. Has been advised in past to take antibiotics in advance of a procedure like teeth cleaning: no 7. Family history of colon cancer: no 8. Alcohol Use: no 9. Illicit drug Use: no 10. History of sleep apnea: no 11. History of coronary artery or other vascular stents placed within the last 12 months: no 12. History of any prior anesthesia complications: no 13. Body mass index is 23.09 kg/m.    MEDICATIONS & ALLERGIES:    Patient reports the following regarding taking any blood thinners:   Plavix? no Aspirin? no Coumadin? no Brilinta? no Xarelto? no Eliquis? no Pradaxa? no Savaysa? no Effient? no  Patient confirms/reports the following medications:  Current Outpatient Medications  Medication Sig Dispense Refill  . aspirin-acetaminophen-caffeine (EXCEDRIN MIGRAINE) 250-250-65 MG tablet Take by mouth as needed for headache.    . b complex vitamins tablet Take 1 tablet by mouth daily.    . Biotin 10 MG TABS Take 10 mg by mouth daily.    . Cholecalciferol (VITAMIN D3) 125 MCG (5000 UT) CAPS Take 5,000 Units by mouth daily.    Janann August 50 MCG tablet Take 50 mcg by mouth daily before breakfast.     . fexofenadine (ALLEGRA) 180 MG tablet Take 180 mg by mouth daily as needed for allergies or rhinitis.    Marland Kitchen ibuprofen (ADVIL) 200 MG tablet Take 200 mg by mouth as needed. Takes 2 tablets as needed.    . Misc Natural Products (GLUCOSAMINE CHOND MSM FORMULA PO) Take 1 tablet by mouth daily.    .  Misc Natural Products (NEURIVA PO) Take by mouth daily.    . Multiple Vitamins-Minerals (MULTIVITAMIN WITH MINERALS) tablet Take 1 tablet by mouth daily.    . naproxen sodium (ALEVE) 220 MG tablet Take 220-440 mg by mouth as needed (pain).    . OMEGA 3-6-9 FATTY ACIDS PO Take 1 capsule by mouth daily.    . Pseudoephedrine-Ibuprofen (ADVIL COLD/SINUS PO) Take by mouth as needed.    . SUMAtriptan (IMITREX) 100 MG tablet Take 100 mg by mouth as needed for migraine. May repeat in 2 hours if headache persists or recurs.    Marland Kitchen UNABLE TO FIND 1 tablet daily. Med Name: Viviscal     No current facility-administered medications for this visit.    Patient confirms/reports the following allergies:  Allergies  Allergen Reactions  . Codeine Nausea And Vomiting    N/V, headaches     No orders of the defined types were placed in this encounter.   AUTHORIZATION INFORMATION Primary Insurance: Medicare,  ID #: [id number] Pre-Cert / Auth required: No, not required  Secondary Insurance: [employer],  [place] #:[id number] ,  Group #: Plan [id number] Pre-Cert / Auth required: No, not required  SCHEDULE INFORMATION: Procedure has been scheduled as follows:  Date: 10/20/2020, Time: 9:30 Location: [facility] with Dr. Marletta Lor  This Gastroenterology Pre-Precedure Review Form is being routed to the following provider(s): Tana Coast, PA

## 2020-09-15 NOTE — Patient Instructions (Signed)
Covid test: 10/17/2020 at 3:30 PM.   Angela Mckay   [birth year] MRN: [id number] Procedure Date: 10/20/2020 Arrival Time: 8:00 AM     [facility] of [facility]  PREPARATION FOR COLONOSCOPY WITH TRI-LYTE PREP   Please notify us immediately if you are diabetic, take iron supplements, or if you are on Coumadin or any other blood thinners.   Please hold the following medications: n/a  You will need to purchase 1 fleet enema and 1 box of Bisacodyl 5mg  tablets.   PROCEDURE IS SCHEDULED FOR Angela Mckay AS FOLLOWS:  Procedure Date: 10/20/2020 Time to register: 8:00 AM Place to register: 10/22/2020 Short Stay Scheduled provider: Dr. [facility]   2 DAYS BEFORE PROCEDURE:  DATE: 10/18/2020   DAY: Saturday Begin clear liquid diet AFTER your lunch meal. NO SOLID FOODS!   1 DAY BEFORE PROCEDURE:  DATE: 10/19/2020   DAY: Sunday  Continue clear liquids the entire day - NO SOLID FOOD.   Diabetic medications adjustments for today: n/a  At 12:00pm (noon): Take 2 (two) Dulcolax (Bisacodyl) tablets  At 2:00pm: Start drinking your solution. Try to drink 1 (one) 8 ounce glass every 10-15 minutes, until you have consumed HALF the jug. (You should complete the first 1/2 of the jug in 2 hours. Wait 30 minutes, then drink 3-4 more glasses of the solution. Your stools should be clear; if not, you may have to consume the rest of the jug.   One hour after completing the solution: take the last 2 (two) Dulcolax (Bisacodyl) tablets, with a clear liquid.  YOU MUST DRINK PLENTY OF CLEAR LIQUIDS DURING YOUR PREP TO REDUCE RISKS OF KIDNEY FAILURE.   Continue clear liquids only, until 4 hours before your scheduled procedure. Do not eat or drink anything after 5:30 AM on 10/20/2020.  EXCEPTION:  If you take medications for your heart, blood pressure or breathing, you may take these medications with a small amount of clear liquid.      DAY OF PROCEDURE:   DATE: 10/20/2020       DAY: Monday The  morning of your procedure give yourself 1 (one) Fleet Enema, at least 1 hour before going to the hospital.   You may take Tylenol products. Please continue your regular medications unless we have instructed otherwise.   Diabetic medications adjustments for today. n/a  Someone MUST be available to drive you home; the hospital will cancel this appointment if you do not have a driver.   Please call the office if you have any questions (Dept: [phone]).  Please see below for Dietary Information.  CLEAR LIQUIDS INCLUDE:  Water Jello (NOT red in color)   Ice Popsicles (NOT red in color)   Tea (sugar ok, no milk/cream) Powdered fruit flavored drinks  Coffee (sugar ok, no milk/cream) Gatorade/ Lemonade/ Kool-Aid  (NOT red in color)   Juice: apple, white grape, white cranberry Soft drinks  Clear bullion, consomme, broth (fat free beef/chicken/vegetable)  Carbonated beverages (any kind)  Strained chicken noodle soup Hard Candy   REMEMBER: Clear liquids are liquids that will allow you to see your fingers on the other side of a clear glass. Be sure liquids are NOT red in color, and not cloudy, but CLEAR.   DO NOT EAT OR DRINK ANY OF THE FOLLOWING:  Dairy products of any kind   Cranberry juice Tomato juice / V8 juice   Grapefruit juice Orange juice     Red grape juice  Do not eat any solid foods,  including such foods as: cereal, oatmeal, yogurt, fruits, vegetables, creamed soups, eggs, bread, etc.    HELPFUL HINTS FOR DRINKING PREP SOLUTION:   Make sure prep is extremely cold. Refrigerate the night before. You may also put in the freezer.   You may try mixing some Crystal Light or Country Time Lemonade if you prefer. Mix in small amounts; add more if necessary.  Try drinking through a straw  Rinse mouth with water or a mouthwash between glasses, to remove after-taste.  Try sipping on a cold beverage /ice/ popsicles between glasses of prep  Place a piece of sugar-free hard candy  in mouth between glasses  If you become nauseated, try consuming smaller amounts, or stretch out the time between glasses. Stop for 30-60 minutes, then slowly start back drinking    You may call the office (Dept: [phone]) before 5:00pm, or page the doctor on call after 5:00pm ([phone]), for further instructions, if necessary.   OTHER INSTRUCTIONS  You will need a responsible adult at least 69 years of age to accompany you and drive you home. This person must remain in the waiting room during your procedure.  Wear loose fitting clothing that is easily removed.  Leave jewelry and other valuables at home.   Remove all body piercing jewelry and leave at home.  Total time from sign-in until discharge is approximately 2-3 hours.  You should go home directly after your procedure and rest. You can resume normal activities the day after your procedure.  The day of your procedure you should not:  Drive  Make legal decisions  Operate machinery  Drink alcohol  Return to work

## 2020-09-15 NOTE — Progress Notes (Signed)
Ok to schedule.  ASA II 

## 2020-10-17 ENCOUNTER — Inpatient Hospital Stay (HOSPITAL_COMMUNITY): Admission: RE | Admit: 2020-10-17 | Payer: Medicare Other | Source: Ambulatory Visit

## 2020-10-20 ENCOUNTER — Ambulatory Visit (HOSPITAL_COMMUNITY): Payer: Medicare Other | Admitting: Anesthesiology

## 2020-10-20 ENCOUNTER — Other Ambulatory Visit: Payer: Self-pay

## 2020-10-20 ENCOUNTER — Encounter (HOSPITAL_COMMUNITY): Payer: Self-pay

## 2020-10-20 ENCOUNTER — Encounter (HOSPITAL_COMMUNITY)
Admission: RE | Disposition: A | Discharge status: Home / Self Care | Payer: Self-pay | Source: Home / Self Care | Attending: Internal Medicine

## 2020-10-20 ENCOUNTER — Ambulatory Visit (HOSPITAL_COMMUNITY)
Admission: RE | Admit: 2020-10-20 | Discharge: 2020-10-20 | Disposition: A | Discharge status: Home / Self Care | Payer: Medicare Other | Attending: Internal Medicine | Admitting: Internal Medicine

## 2020-10-20 DIAGNOSIS — K5730 Diverticulosis of large intestine without perforation or abscess without bleeding: Secondary | ICD-10-CM | POA: Diagnosis not present

## 2020-10-20 DIAGNOSIS — Z86010 Personal history of colonic polyps: Secondary | ICD-10-CM

## 2020-10-20 DIAGNOSIS — K648 Other hemorrhoids: Secondary | ICD-10-CM | POA: Diagnosis not present

## 2020-10-20 DIAGNOSIS — Z885 Allergy status to narcotic agent status: Secondary | ICD-10-CM | POA: Diagnosis not present

## 2020-10-20 DIAGNOSIS — Z1211 Encounter for screening for malignant neoplasm of colon: Secondary | ICD-10-CM | POA: Insufficient documentation

## 2020-10-20 DIAGNOSIS — Z90710 Acquired absence of both cervix and uterus: Secondary | ICD-10-CM | POA: Diagnosis not present

## 2020-10-20 HISTORY — PX: COLONOSCOPY WITH PROPOFOL: SHX5780

## 2020-10-20 SURGERY — COLONOSCOPY WITH PROPOFOL
Anesthesia: General

## 2020-10-20 MED ORDER — PROPOFOL 10 MG/ML IV BOLUS
INTRAVENOUS | Status: DC | PRN
  Administered 2020-10-20: 100 mg via INTRAVENOUS
  Administered 2020-10-20: 125 ug/kg/min via INTRAVENOUS

## 2020-10-20 MED ORDER — LACTATED RINGERS IV SOLN: INTRAVENOUS | Status: DC

## 2020-10-20 MED ORDER — STERILE WATER FOR IRRIGATION IR SOLN
Status: DC | PRN
  Administered 2020-10-20: 100 mL

## 2020-10-20 NOTE — Anesthesia Preprocedure Evaluation (Addendum)
Anesthesia Evaluation  Patient identified by MRN, date of birth, ID band Patient awake    Reviewed: Allergy & Precautions, NPO status , Patient's Chart, lab work & pertinent test results  History of Anesthesia Complications Negative for: history of anesthetic complications  Airway Mallampati: II  TM Distance: >3 FB Neck ROM: Full   Comment: Nasal bone fx Dental no notable dental hx. (+) Dental Advisory Given, Teeth Intact   Pulmonary neg pulmonary ROS,    Pulmonary exam normal breath sounds clear to auscultation       Cardiovascular Exercise Tolerance: Good negative cardio ROS Normal cardiovascular exam Rhythm:Regular Rate:Normal     Neuro/Psych  Headaches, negative psych ROS   GI/Hepatic negative GI ROS,   Endo/Other  Hypothyroidism   Renal/GU      Musculoskeletal  (+) Arthritis ,   Abdominal   Peds  Hematology   Anesthesia Other Findings Nasal bone fx  Reproductive/Obstetrics                            Anesthesia Physical  Anesthesia Plan  ASA: 2  Anesthesia Plan: General   Post-op Pain Management:    Induction:   PONV Risk Score and Plan:   Airway Management Planned: Nasal Cannula, Natural Airway and Simple Face Mask  Additional Equipment:   Intra-op Plan:   Post-operative Plan:   Informed Consent: I have reviewed the patients History and Physical, chart, labs and discussed the procedure including the risks, benefits and alternatives for the proposed anesthesia with the patient or authorized representative who has indicated his/her understanding and acceptance.     Dental advisory given  Plan Discussed with: CRNA and Surgeon  Anesthesia Plan Comments:         Anesthesia Quick Evaluation

## 2020-10-20 NOTE — Discharge Instructions (Addendum)

## 2020-10-20 NOTE — H&P (Signed)
Primary Care Physician:  Janean Sark, NP Primary Gastroenterologist:  Dr. Marletta Lor  Pre-Procedure History & Physical: HPI:  Angela Mckay is a 70 y.o. female is here for a colonoscopy for colon cancer surveillance purposes due to personal history of polyps. Last TCS 09/25/2014 by Dr. Teena Dunk, tubular adenoma  Past Medical History:  Diagnosis Date   Arthritis    Headache    Hypothyroidism     Past Surgical History:  Procedure Laterality Date   ABDOMINAL HYSTERECTOMY     BONE EXOSTOSIS EXCISION Right 12/27/2018   Procedure: REMOVAL EXOSTOSIS RIGHT FOOT;  Surgeon: Erskine Emery, DPM;  Location: [facility];  Service: Podiatry;  Laterality: Right;   DIAGNOSTIC LAPAROSCOPY      Prior to Admission medications   Medication Sig Start Date End Date Taking? Authorizing Provider  aspirin-acetaminophen-caffeine (EXCEDRIN MIGRAINE) [phone] MG tablet Take 1 tablet by mouth every 6 (six) hours as needed for headache.   Yes [provider]  b complex vitamins tablet Take 1 tablet by mouth daily.   Yes [provider]  Boswellia-Glucosamine-Vit D (OSTEO BI-FLEX ONE PER DAY) TABS Take 1 tablet by mouth daily.   Yes [provider]  Cholecalciferol (VITAMIN D3) 125 MCG (5000 UT) CAPS Take 5,000 Units by mouth daily.   Yes [provider]  EUTHYROX 50 MCG tablet Take 50 mcg by mouth daily before breakfast.  11/03/18  Yes [provider]  fexofenadine (ALLEGRA) 180 MG tablet Take 180 mg by mouth daily as needed for allergies or rhinitis.   Yes [provider]  ibuprofen (ADVIL) 200 MG tablet Take 400 mg by mouth every 6 (six) hours as needed for headache or moderate pain.   Yes [provider]  Misc Natural Products (NEURIVA PO) Take 1 capsule by mouth daily.   Yes [provider]  Multiple Vitamins-Minerals (MULTIVITAMIN WITH MINERALS) tablet Take 1 tablet by mouth daily.   Yes [provider]  naproxen sodium  (ALEVE) 220 MG tablet Take 220 mg by mouth 2 (two) times daily as needed (pain).   Yes [provider]  OMEGA 3-6-9 FATTY ACIDS PO Take 1 capsule by mouth daily.   Yes [provider]  Pseudoephedrine-Ibuprofen (ADVIL COLD/SINUS PO) Take 1 tablet by mouth daily as needed (allergies).   Yes [provider]  Specialty Vitamins Products (BIOTIN PLUS KERATIN PO) Take 1 tablet by mouth daily.   Yes [provider]  UNABLE TO FIND Take 1 tablet by mouth daily. Med Name: Viviscal   Yes [provider]  SUMAtriptan (IMITREX) 100 MG tablet Take 100 mg by mouth as needed for migraine. May repeat in 2 hours if headache persists or recurs.    [provider]    Allergies as of 09/16/2020 - Review Complete 09/15/2020  Allergen Reaction Noted   Codeine Nausea And Vomiting 12/18/2018    History reviewed. No pertinent family history.  Social History   Socioeconomic History   Marital status: Single    Spouse name: Not on file   Number of children: Not on file   Years of education: Not on file   Highest education level: Not on file  Occupational History   Not on file  Tobacco Use   Smoking status: Never   Smokeless tobacco: Never  Vaping Use   Vaping Use: Never used  Substance and Sexual Activity   Alcohol use: Never   Drug use: Never   Sexual activity: Yes    Birth control/protection: Surgical  Other  Topics Concern   Not on file  Social History Narrative   Not on file   Social Determinants of Health   Financial Resource Strain: Not on file  Food Insecurity: Not on file  Transportation Needs: Not on file  Physical Activity: Not on file  Stress: Not on file  Social Connections: Not on file  Intimate Partner Violence: Not on file    Review of Systems: See HPI, otherwise negative ROS  Physical Exam: Vital signs in last 24 hours: Temp:  [97.9 F (36.6 C)] 97.9 F (36.6 C) (06/20 0810) Resp:  [17] 17 (06/20 0810) BP:  (157)/(93) 157/93 (06/20 0810) SpO2:  [99 %] 99 % (06/20 0810) Weight:  [54 kg] 54 kg (06/20 0810)   General:   Alert,  Well-developed, well-nourished, pleasant and cooperative in NAD Head:  Normocephalic and atraumatic. Eyes:  Sclera clear, no icterus.   Conjunctiva pink. Ears:  Normal auditory acuity. Nose:  No deformity, discharge,  or lesions. Mouth:  No deformity or lesions, dentition normal. Neck:  Supple; no masses or thyromegaly. Lungs:  Clear throughout to auscultation.   No wheezes, crackles, or rhonchi. No acute distress. Heart:  Regular rate and rhythm; no murmurs, clicks, rubs,  or gallops. Abdomen:  Soft, nontender and nondistended. No masses, hepatosplenomegaly or hernias noted. Normal bowel sounds, without guarding, and without rebound.   Msk:  Symmetrical without gross deformities. Normal posture. Extremities:  Without clubbing or edema. Neurologic:  Alert and  oriented x4;  grossly normal neurologically. Skin:  Intact without significant lesions or rashes. Cervical Nodes:  No significant cervical adenopathy. Psych:  Alert and cooperative. Normal mood and affect.  Impression/Plan: Angela Mckay is here for a colonoscopy for colon cancer surveillance purposes due to personal history of polyps. Last TCS 09/25/2014 by Dr. Teena Dunk, tubular adenoma  The risks of the procedure including infection, bleed, or perforation as well as benefits, limitations, alternatives and imponderables have been reviewed with the patient. Questions have been answered. All parties agreeable.

## 2020-10-20 NOTE — Op Note (Signed)
[facility] Patient Name: Angela Mckay Procedure Date: 10/20/2020 8:21 AM MRN: [id number] Date of Birth: [birth year] Attending MD: Elon Alas. Abbey Chatters DO CSN: [id number] Age: 70 Admit Type: Outpatient Procedure:                Colonoscopy Indications:              High risk colon cancer surveillance: Personal                            history of colonic polyps Providers:                Elon Alas. Abbey Chatters, DO, Lambert Mody, Dereck Leep, Technician Referring MD:              Medicines:                See the Anesthesia note for documentation of the                            administered medications Complications:            No immediate complications. Estimated Blood Loss:     Estimated blood loss: none. Procedure:                Pre-Anesthesia Assessment:                           - The anesthesia plan was to use monitored                            anesthesia care (MAC).                           After obtaining informed consent, the colonoscope                            was passed under direct vision. Throughout the                            procedure, the patient's blood pressure, pulse, and                            oxygen saturations were monitored continuously. The                            [id number]-[id number] ([id number]) scope was introduced through                            the anus and advanced to the the cecum, identified                            by appendiceal orifice and ileocecal valve. The                            colonoscopy was technically difficult and complex  due to restricted mobility of the colon. The                            patient tolerated the procedure well. The quality                            of the bowel preparation was evaluated using the                            BBPS [facility] Bowel Preparation Scale) with scores                            of: Right Colon = 3, Transverse Colon =  3 and Left                            Colon = 3 (entire mucosa seen well with no residual                            staining, small fragments of stool or opaque                            liquid). The total BBPS score equals 9. Scope In: 8:33:17 AM Scope Out: 8:57:41 AM Scope Withdrawal Time: 0 hours 7 minutes 10 seconds  Total Procedure Duration: 0 hours 24 minutes 24 seconds  Findings:      The perianal and digital rectal examinations were normal.      Non-bleeding internal hemorrhoids were found during endoscopy.      Multiple small and large-mouthed diverticula were found in the sigmoid       colon and descending colon.      The exam was otherwise without abnormality. Impression:               - Non-bleeding internal hemorrhoids.                           - Diverticulosis in the sigmoid colon and in the                            descending colon.                           - The examination was otherwise normal.                           - No specimens collected. Moderate Sedation:      Per Anesthesia Care Recommendation:           - Patient has a contact number available for                            emergencies. The signs and symptoms of potential                            delayed complications were discussed with the  patient. Return to normal activities tomorrow.                            Written discharge instructions were provided to the                            patient.                           - Resume previous diet.                           - Continue present medications.                           - Repeat colonoscopy in 5 years for surveillance.                           - Return to GI clinic PRN. Procedure Code(s):        --- Professional ---                           Z5638, Colorectal cancer screening; colonoscopy on                            individual at high risk Diagnosis Code(s):        --- Professional ---                            Z86.010, Personal history of colonic polyps                           K64.8, Other hemorrhoids                           K57.30, Diverticulosis of large intestine without                            perforation or abscess without bleeding CPT copyright 2019 American Medical Association. All rights reserved. The codes documented in this report are preliminary and upon coder review may  be revised to meet current compliance requirements. Elon Alas. Abbey Chatters, DO [place] Abbey Chatters, DO 10/20/2020 9:00:29 AM This report has been signed electronically. Number of Addenda: 0

## 2020-10-20 NOTE — Transfer of Care (Signed)
Immediate Anesthesia Transfer of Care Note  Patient: Angela Mckay  Procedure(s) Performed: COLONOSCOPY WITH PROPOFOL  Patient Location: Endoscopy Unit  Anesthesia Type:General  Level of Consciousness: awake, oriented, drowsy and patient cooperative  Airway & Oxygen Therapy: Patient Spontanous Breathing  Post-op Assessment: Report given to RN, Post -op Vital signs reviewed and stable and Patient moving all extremities  Post vital signs: Reviewed and stable  Last Vitals:  Vitals Value Taken Time  BP    Temp    Pulse    Resp    SpO2      Last Pain:  Vitals:   10/20/20 0830  TempSrc:   PainSc: 5       Patients Stated Pain Goal: 10 (10/20/20 0810)  Complications: No notable events documented.

## 2020-10-20 NOTE — Anesthesia Postprocedure Evaluation (Signed)
Anesthesia Post Note  Patient: Angela Mckay  Procedure(s) Performed: COLONOSCOPY WITH PROPOFOL  Patient location during evaluation: Endoscopy Anesthesia Type: General Level of consciousness: awake and alert and oriented Pain management: pain level controlled Vital Signs Assessment: post-procedure vital signs reviewed and stable Respiratory status: spontaneous breathing and respiratory function stable Cardiovascular status: blood pressure returned to baseline and stable Postop Assessment: no apparent nausea or vomiting Anesthetic complications: no   No notable events documented.   Last Vitals:  Vitals:   10/20/20 0906 10/20/20 0911  BP: 96/65 105/76  Pulse: 87 80  Resp: 19 15  Temp:    SpO2: 99% 100%    Last Pain:  Vitals:   10/20/20 0911  TempSrc:   PainSc: 0-No pain                 Delonna Ney C Dahlia Nifong

## 2020-10-27 ENCOUNTER — Encounter (HOSPITAL_COMMUNITY): Payer: Self-pay | Admitting: Internal Medicine

## 2020-12-24 ENCOUNTER — Ambulatory Visit (INDEPENDENT_AMBULATORY_CARE_PROVIDER_SITE_OTHER): Payer: Medicare Other | Admitting: Internal Medicine

## 2020-12-24 ENCOUNTER — Encounter: Payer: Self-pay | Admitting: *Deleted

## 2020-12-24 ENCOUNTER — Other Ambulatory Visit: Payer: Self-pay

## 2020-12-24 ENCOUNTER — Encounter: Payer: Self-pay | Admitting: Internal Medicine

## 2020-12-24 VITALS — BP 132/84 | HR 90 | Temp 97.1°F | Ht 61.0 in | Wt 123.0 lb

## 2020-12-24 DIAGNOSIS — R109 Unspecified abdominal pain: Secondary | ICD-10-CM | POA: Diagnosis not present

## 2020-12-24 DIAGNOSIS — R140 Abdominal distension (gaseous): Secondary | ICD-10-CM | POA: Diagnosis not present

## 2020-12-24 NOTE — Progress Notes (Signed)
Referring Provider: Janean Sark Primary Care Physician:  Janean Sark, NP Primary GI:  Dr. Marletta Lor  Chief Complaint  Patient presents with   Follow-up    Cramps after colonoscopy, bloating and gas. When she eats everything stops in her chest.    HPI:   Angela Mckay is a 70 y.o. female who presents to the today for procedure follow-up visit.  Had a colonoscopy 10/20/2020 for surveillance purposes which was relatively unremarkable besides diverticulosis.  She states approximately 1 to 2 weeks after her procedure she had development of abdominal pain.  Primarily epigastric but some lower abdominal pain as well.  Is also noted bloating and gas.  No change in her bowel movements.  No melena hematochezia.  She went to [facility] ER and apparently had a CT scan which is unremarkable per patient though I do not have access to this report.  She is placed on pantoprazole and given Zofran for as needed nausea.  She states she took the pantoprazole for approximately a month and then stopped when the prescription ran out.  She does note it helps some though she does not like taking pills.  No heartburn or acid reflux.  Past Medical History:  Diagnosis Date   Arthritis    Headache    Hypothyroidism     Past Surgical History:  Procedure Laterality Date   ABDOMINAL HYSTERECTOMY     BONE EXOSTOSIS EXCISION Right 12/27/2018   Procedure: REMOVAL EXOSTOSIS RIGHT FOOT;  Surgeon: Erskine Emery, DPM;  Location: [facility];  Service: Podiatry;  Laterality: Right;   COLONOSCOPY WITH PROPOFOL N/A 10/20/2020   Procedure: COLONOSCOPY WITH PROPOFOL;  Surgeon: Lanelle Bal, DO;  Location: [facility] SUITE;  Service: Endoscopy;  Laterality: N/A;  ASA II / 9:30   DIAGNOSTIC LAPAROSCOPY      Current Outpatient Medications  Medication Sig Dispense Refill   aspirin-acetaminophen-caffeine (EXCEDRIN MIGRAINE) 250-250-65 MG tablet Take 1 tablet by mouth every 6 (six) hours as needed for  headache.     b complex vitamins tablet Take 1 tablet by mouth daily.     Boswellia-Glucosamine-Vit D (OSTEO BI-FLEX ONE PER DAY) TABS Take 1 tablet by mouth daily.     Cholecalciferol (VITAMIN D3) 125 MCG (5000 UT) CAPS Take 5,000 Units by mouth daily.     fexofenadine (ALLEGRA) 180 MG tablet Take 180 mg by mouth daily as needed for allergies or rhinitis.     ibuprofen (ADVIL) 200 MG tablet Take 400 mg by mouth every 6 (six) hours as needed for headache or moderate pain.     Misc Natural Products (NEURIVA PO) Take 1 capsule by mouth daily.     Multiple Vitamins-Minerals (MULTIVITAMIN WITH MINERALS) tablet Take 1 tablet by mouth daily.     naproxen sodium (ALEVE) 220 MG tablet Take 220 mg by mouth 2 (two) times daily as needed (pain).     OMEGA 3-6-9 FATTY ACIDS PO Take 1 capsule by mouth daily.     ondansetron (ZOFRAN) 24 MG tablet Take 24 mg by mouth once.     Specialty Vitamins Products (BIOTIN PLUS KERATIN PO) Take 1 tablet by mouth daily.     SUMAtriptan (IMITREX) 100 MG tablet Take 100 mg by mouth as needed for migraine. May repeat in 2 hours if headache persists or recurs.     EUTHYROX 50 MCG tablet Take 50 mcg by mouth daily before breakfast.      Pseudoephedrine-Ibuprofen (ADVIL COLD/SINUS PO) Take 1 tablet by mouth daily as  needed (allergies). (Patient not taking: Reported on 12/24/2020)     UNABLE TO FIND Take 1 tablet by mouth daily. Med Name: Viviscal     No current facility-administered medications for this visit.    Allergies as of 12/24/2020 - Review Complete 10/20/2020  Allergen Reaction Noted   Codeine Nausea And Vomiting 12/18/2018    No family history on file.  Social History   Socioeconomic History   Marital status: Single    Spouse name: Not on file   Number of children: Not on file   Years of education: Not on file   Highest education level: Not on file  Occupational History   Not on file  Tobacco Use   Smoking status: Never   Smokeless tobacco: Never   Vaping Use   Vaping Use: Never used  Substance and Sexual Activity   Alcohol use: Never   Drug use: Never   Sexual activity: Yes    Birth control/protection: Surgical  Other Topics Concern   Not on file  Social History Narrative   Not on file   Social Determinants of Health   Financial Resource Strain: Not on file  Food Insecurity: Not on file  Transportation Needs: Not on file  Physical Activity: Not on file  Stress: Not on file  Social Connections: Not on file    Subjective: Review of Systems  Constitutional:  Negative for chills and fever.  HENT:  Negative for congestion and hearing loss.   Eyes:  Negative for blurred vision and double vision.  Respiratory:  Negative for cough and shortness of breath.   Cardiovascular:  Negative for chest pain and palpitations.  Gastrointestinal:  Positive for abdominal pain. Negative for blood in stool, constipation, diarrhea, heartburn, melena and vomiting.       Bloating  Genitourinary:  Negative for dysuria and urgency.  Musculoskeletal:  Negative for joint pain and myalgias.  Skin:  Negative for itching and rash.  Neurological:  Negative for dizziness and headaches.  Psychiatric/Behavioral:  Negative for depression. The patient is not nervous/anxious.     Objective: BP 132/84   Pulse 90   Temp (!) 97.1 F (36.2 C) (Temporal)   Ht 5\' 1"  (1.549 m)   Wt 123 lb (55.8 kg)   BMI 23.24 kg/m  Physical Exam Constitutional:      Appearance: Normal appearance.  HENT:     Head: Normocephalic and atraumatic.  Eyes:     Extraocular Movements: Extraocular movements intact.     Conjunctiva/sclera: Conjunctivae normal.  Cardiovascular:     Rate and Rhythm: Normal rate and regular rhythm.  Pulmonary:     Effort: Pulmonary effort is normal.     Breath sounds: Normal breath sounds.  Abdominal:     General: Bowel sounds are normal.     Palpations: Abdomen is soft.  Musculoskeletal:        General: No swelling. Normal range of  motion.     Cervical back: Normal range of motion and neck supple.  Skin:    General: Skin is warm and dry.     Coloration: Skin is not jaundiced.  Neurological:     General: No focal deficit present.     Mental Status: She is alert and oriented to person, place, and time.  Psychiatric:        Mood and Affect: Mood normal.        Behavior: Behavior normal.     Assessment: *Abdominal pain *Bloating  Plan: Etiology of patient's symptoms unclear.  Colonoscopy was benign.  We will request records of CT imaging from [facility] ER.  I will order right upper quadrant ultrasound to rule out biliary colic.  She may need an EGD if this is unremarkable to rule out peptic ulcer disease.  Continue on digestive enzymes, recommend she add probiotics as well.  Further recommendations to follow  12/24/2020 3:42 PM   Disclaimer: This note was dictated with voice recognition software. Similar sounding words can inadvertently be transcribed and may not be corrected upon review.

## 2020-12-24 NOTE — Patient Instructions (Signed)
We will schedule you for a right upper quadrant ultrasound to further evaluate your abdominal pain and bloating to rule out gallbladder as potential cause of your problems.  Continue on digestive enzymes.  Would recommend you add probiotics as well.  If your symptoms persist and your ultrasound is unremarkable, we will likely need to proceed with upper endoscopy to rule out potential gastric ulcer/gastritis.  Follow-up in 3 months.  It was great seeing you again today.  Dr. Marletta Lor   At [facility] we value your feedback. You may receive a survey about your visit today. Please share your experience as we strive to create trusting relationships with our patients to provide genuine, compassionate, quality care.  We appreciate your understanding and patience as we review any laboratory studies, imaging, and other diagnostic tests that are ordered as we care for you. Our office policy is 5 business days for review of these results, and any emergent or urgent results are addressed in a timely manner for your best interest. If you do not hear from our office in 1 week, please contact us.   We also encourage the use of MyChart, which contains your medical information for your review as well. If you are not enrolled in this feature, an access code is on this after visit summary for your convenience. Thank you for allowing [place] to be involved in your care.  It was great to see you today!  I hope you have a great rest of your summer!!    Hennie Duos. Marletta Lor, D.O. Gastroenterology and Hepatology [facility]

## 2021-01-02 ENCOUNTER — Ambulatory Visit (HOSPITAL_COMMUNITY): Payer: Medicare Other

## 2021-01-09 ENCOUNTER — Other Ambulatory Visit: Payer: Self-pay

## 2021-01-09 ENCOUNTER — Ambulatory Visit (HOSPITAL_COMMUNITY)
Admission: RE | Admit: 2021-01-09 | Discharge: 2021-01-09 | Disposition: A | Discharge status: Home / Self Care | Payer: Medicare Other | Source: Ambulatory Visit | Attending: Internal Medicine | Admitting: Internal Medicine

## 2021-01-09 DIAGNOSIS — R109 Unspecified abdominal pain: Secondary | ICD-10-CM | POA: Diagnosis not present

## 2021-03-09 ENCOUNTER — Encounter: Payer: Self-pay | Admitting: Internal Medicine

## 2021-04-29 ENCOUNTER — Ambulatory Visit: Payer: Medicare Other | Admitting: Internal Medicine

## 2021-05-21 ENCOUNTER — Ambulatory Visit (INDEPENDENT_AMBULATORY_CARE_PROVIDER_SITE_OTHER): Payer: Medicare Other | Admitting: Internal Medicine

## 2021-05-21 ENCOUNTER — Other Ambulatory Visit: Payer: Self-pay

## 2021-05-21 ENCOUNTER — Encounter: Payer: Self-pay | Admitting: Internal Medicine

## 2021-05-21 VITALS — BP 139/91 | HR 93 | Temp 97.7°F | Ht 61.0 in | Wt 120.0 lb

## 2021-05-21 DIAGNOSIS — K219 Gastro-esophageal reflux disease without esophagitis: Secondary | ICD-10-CM

## 2021-05-21 DIAGNOSIS — R1013 Epigastric pain: Secondary | ICD-10-CM

## 2021-05-21 DIAGNOSIS — R140 Abdominal distension (gaseous): Secondary | ICD-10-CM

## 2021-05-21 DIAGNOSIS — G8929 Other chronic pain: Secondary | ICD-10-CM | POA: Diagnosis not present

## 2021-05-21 NOTE — H&P (View-Only) (Signed)
Referring Provider: Janean Sark Primary Care Physician:  Janean Sark, NP Primary GI:  Dr. Marletta Lor  Chief Complaint  Patient presents with   Nausea    With eating    Abdominal Pain    Cramping, epigastric   Gastroesophageal Reflux    Bending over acid comes back up    HPI:   Angela Mckay is a 71 y.o. female who presents to the today for follow-up visit.  Had a colonoscopy 10/20/2020 for surveillance purposes which was relatively unremarkable besides diverticulosis.  She states approximately 1 to 2 weeks after her procedure she had development of abdominal pain.  Primarily epigastric but some lower abdominal pain as well.  Is also noted bloating and gas.  No change in her bowel movements.  No melena hematochezia.    Also with worsening acid reflux.  Was previously on pantoprazole though stopped taking this as she is not a big fan of taking medications.  No dysphagia or odynophagia.  Underwent right upper quadrant ultrasound which did not show cholelithiasis or cholecystitis.   Past Medical History:  Diagnosis Date   Arthritis    Headache    Hypothyroidism     Past Surgical History:  Procedure Laterality Date   ABDOMINAL HYSTERECTOMY     BONE EXOSTOSIS EXCISION Right 12/27/2018   Procedure: REMOVAL EXOSTOSIS RIGHT FOOT;  Surgeon: Erskine Emery, DPM;  Location: [facility];  Service: Podiatry;  Laterality: Right;   COLONOSCOPY WITH PROPOFOL N/A 10/20/2020   Procedure: COLONOSCOPY WITH PROPOFOL;  Surgeon: Lanelle Bal, DO;  Location: [facility];  Service: Endoscopy;  Laterality: N/A;  ASA II / 9:30   DIAGNOSTIC LAPAROSCOPY      Current Outpatient Medications  Medication Sig Dispense Refill   aspirin-acetaminophen-caffeine (EXCEDRIN MIGRAINE) 250-250-65 MG tablet Take 1 tablet by mouth every 6 (six) hours as needed for headache.     b complex vitamins tablet Take 1 tablet by mouth daily.     Boswellia-Glucosamine-Vit D (OSTEO BI-FLEX ONE PER  DAY) TABS Take 1 tablet by mouth daily.     Cholecalciferol (VITAMIN D3) 125 MCG (5000 UT) CAPS Take 5,000 Units by mouth daily.     Digestive Enzymes (DIGESTIVE ENZYME PO) Take by mouth.     EUTHYROX 50 MCG tablet Take 50 mcg by mouth daily before breakfast.      fexofenadine (ALLEGRA) 180 MG tablet Take 180 mg by mouth daily as needed for allergies or rhinitis.     ibuprofen (ADVIL) 200 MG tablet Take 400 mg by mouth every 6 (six) hours as needed for headache or moderate pain.     Misc Natural Products (NEURIVA PO) Take 1 capsule by mouth daily.     Multiple Vitamins-Minerals (MULTIVITAMIN WITH MINERALS) tablet Take 1 tablet by mouth daily.     naproxen sodium (ALEVE) 220 MG tablet Take 220 mg by mouth 2 (two) times daily as needed (pain).     OMEGA 3-6-9 FATTY ACIDS PO Take 1 capsule by mouth daily.     Pseudoephedrine-Ibuprofen (ADVIL COLD/SINUS PO) Take 1 tablet by mouth daily as needed (allergies).     psyllium (METAMUCIL) 58.6 % packet Take 1 packet by mouth daily.     Specialty Vitamins Products (BIOTIN PLUS KERATIN PO) Take 1 tablet by mouth daily.     SUMAtriptan (IMITREX) 100 MG tablet Take 100 mg by mouth as needed for migraine. May repeat in 2 hours if headache persists or recurs.     UNABLE TO FIND Take  1 tablet by mouth daily. Med Name: Viviscal     ondansetron (ZOFRAN) 24 MG tablet Take 24 mg by mouth once. (Patient not taking: Reported on 05/21/2021)     No current facility-administered medications for this visit.    Allergies as of 05/21/2021 - Review Complete 05/21/2021  Allergen Reaction Noted   Codeine Nausea And Vomiting 12/18/2018    No family history on file.  Social History   Socioeconomic History   Marital status: Single    Spouse name: Not on file   Number of children: Not on file   Years of education: Not on file   Highest education level: Not on file  Occupational History   Not on file  Tobacco Use   Smoking status: Never   Smokeless tobacco: Never   Vaping Use   Vaping Use: Never used  Substance and Sexual Activity   Alcohol use: Never   Drug use: Never   Sexual activity: Yes    Birth control/protection: Surgical  Other Topics Concern   Not on file  Social History Narrative   Not on file   Social Determinants of Health   Financial Resource Strain: Not on file  Food Insecurity: Not on file  Transportation Needs: Not on file  Physical Activity: Not on file  Stress: Not on file  Social Connections: Not on file    Subjective: Review of Systems  Constitutional:  Negative for chills and fever.  HENT:  Negative for congestion and hearing loss.   Eyes:  Negative for blurred vision and double vision.  Respiratory:  Negative for cough and shortness of breath.   Cardiovascular:  Negative for chest pain and palpitations.  Gastrointestinal:  Positive for abdominal pain and heartburn. Negative for blood in stool, constipation, diarrhea, melena and vomiting.       Bloating  Genitourinary:  Negative for dysuria and urgency.  Musculoskeletal:  Negative for joint pain and myalgias.  Skin:  Negative for itching and rash.  Neurological:  Negative for dizziness and headaches.  Psychiatric/Behavioral:  Negative for depression. The patient is not nervous/anxious.     Objective: BP (!) 139/91    Pulse 93    Temp 97.7 F (36.5 C)    Ht 5\' 1"  (1.549 m)    Wt 120 lb (54.4 kg)    BMI 22.67 kg/m  Physical Exam Constitutional:      Appearance: Normal appearance.  HENT:     Head: Normocephalic and atraumatic.  Eyes:     Extraocular Movements: Extraocular movements intact.     Conjunctiva/sclera: Conjunctivae normal.  Cardiovascular:     Rate and Rhythm: Normal rate and regular rhythm.  Pulmonary:     Effort: Pulmonary effort is normal.     Breath sounds: Normal breath sounds.  Abdominal:     General: Bowel sounds are normal.     Palpations: Abdomen is soft.  Musculoskeletal:        General: No swelling. Normal range of motion.      Cervical back: Normal range of motion and neck supple.  Skin:    General: Skin is warm and dry.     Coloration: Skin is not jaundiced.  Neurological:     General: No focal deficit present.     Mental Status: She is alert and oriented to person, place, and time.  Psychiatric:        Mood and Affect: Mood normal.        Behavior: Behavior normal.     Assessment: *Abdominal  pain *Bloating *Acid reflux  Plan: Will schedule for EGD to evaluate for peptic ulcer disease, esophagitis, gastritis, H. Pylori, duodenitis, or other. Will also evaluate for esophageal stricture, Schatzki's ring, esophageal web or other.   The risks including infection, bleed, or perforation as well as benefits, limitations, alternatives and imponderables have been reviewed with the patient. Potential for esophageal dilation, biopsy, etc. have also been reviewed.  Questions have been answered. All parties agreeable.  May need to consider putting back on PPI therapy pending endoscopic findings  Right upper quadrant ultrasound negative for cholelithiasis or cholecystitis.  Continue on digestive enzymes  Further recommendations to follow  05/21/2021 3:37 PM   Disclaimer: This note was dictated with voice recognition software. Similar sounding words can inadvertently be transcribed and may not be corrected upon review.

## 2021-05-21 NOTE — Patient Instructions (Signed)
We will schedule you for upper endoscopy to further evaluate your GI symptoms.  We may need to consider starting you on a medication depending on endoscopic findings.  Further recommendations to follow.  It was nice seeing you again today.  Dr. Marletta Lor  At [facility] we value your feedback. You may receive a survey about your visit today. Please share your experience as we strive to create trusting relationships with our patients to provide genuine, compassionate, quality care.  We appreciate your understanding and patience as we review any laboratory studies, imaging, and other diagnostic tests that are ordered as we care for you. Our office policy is 5 business days for review of these results, and any emergent or urgent results are addressed in a timely manner for your best interest. If you do not hear from our office in 1 week, please contact us.   We also encourage the use of MyChart, which contains your medical information for your review as well. If you are not enrolled in this feature, an access code is on this after visit summary for your convenience. Thank you for allowing [place] to be involved in your care.  It was great to see you today!  I hope you have a great rest of your Winter!    Hennie Duos. Marletta Lor, D.O. Gastroenterology and Hepatology [facility]

## 2021-05-21 NOTE — Progress Notes (Signed)
° ° °Referring Provider: Richardson, Jennifer P,* °Primary Care Physician:  Richardson, Jennifer P, NP °Primary GI:  Dr. Marysa Wessner ° °Chief Complaint  °Patient presents with  ° Nausea  °  With eating   ° Abdominal Pain  °  Cramping, epigastric  ° Gastroesophageal Reflux  °  Bending over acid comes back up  ° ° °HPI:   °Angela Mckay is a 71 y.o. female who presents to the today for follow-up visit.  Had a colonoscopy 10/20/2020 for surveillance purposes which was relatively unremarkable besides diverticulosis.  She states approximately 1 to 2 weeks after her procedure she had development of abdominal pain.  Primarily epigastric but some lower abdominal pain as well.  Is also noted bloating and gas.  No change in her bowel movements.  No melena hematochezia.   ° °Also with worsening acid reflux.  Was previously on pantoprazole though stopped taking this as she is not a big fan of taking medications. ° °No dysphagia or odynophagia. ° °Underwent right upper quadrant ultrasound which did not show cholelithiasis or cholecystitis. ° ° °Past Medical History:  °Diagnosis Date  ° Arthritis   ° Headache   ° Hypothyroidism   ° ° °Past Surgical History:  °Procedure Laterality Date  ° ABDOMINAL HYSTERECTOMY    ° BONE EXOSTOSIS EXCISION Right 12/27/2018  ° Procedure: REMOVAL EXOSTOSIS RIGHT FOOT;  Surgeon: Patel, Prayashkumar, DPM;  Location: [facility];  Service: Podiatry;  Laterality: Right;  ° COLONOSCOPY WITH PROPOFOL N/A 10/20/2020  ° Procedure: COLONOSCOPY WITH PROPOFOL;  Surgeon: Chandrika Sandles K, DO;  Location: [facility];  Service: Endoscopy;  Laterality: N/A;  ASA II / 9:30  ° DIAGNOSTIC LAPAROSCOPY    ° ° °Current Outpatient Medications  °Medication Sig Dispense Refill  ° aspirin-acetaminophen-caffeine (EXCEDRIN MIGRAINE) 250-250-65 MG tablet Take 1 tablet by mouth every 6 (six) hours as needed for headache.    ° b complex vitamins tablet Take 1 tablet by mouth daily.    ° Boswellia-Glucosamine-Vit D (OSTEO BI-FLEX ONE PER  DAY) TABS Take 1 tablet by mouth daily.    ° Cholecalciferol (VITAMIN D3) 125 MCG (5000 UT) CAPS Take 5,000 Units by mouth daily.    ° Digestive Enzymes (DIGESTIVE ENZYME PO) Take by mouth.    ° EUTHYROX 50 MCG tablet Take 50 mcg by mouth daily before breakfast.     ° fexofenadine (ALLEGRA) 180 MG tablet Take 180 mg by mouth daily as needed for allergies or rhinitis.    ° ibuprofen (ADVIL) 200 MG tablet Take 400 mg by mouth every 6 (six) hours as needed for headache or moderate pain.    ° Misc Natural Products (NEURIVA PO) Take 1 capsule by mouth daily.    ° Multiple Vitamins-Minerals (MULTIVITAMIN WITH MINERALS) tablet Take 1 tablet by mouth daily.    ° naproxen sodium (ALEVE) 220 MG tablet Take 220 mg by mouth 2 (two) times daily as needed (pain).    ° OMEGA 3-6-9 FATTY ACIDS PO Take 1 capsule by mouth daily.    ° Pseudoephedrine-Ibuprofen (ADVIL COLD/SINUS PO) Take 1 tablet by mouth daily as needed (allergies).    ° psyllium (METAMUCIL) 58.6 % packet Take 1 packet by mouth daily.    ° Specialty Vitamins Products (BIOTIN PLUS KERATIN PO) Take 1 tablet by mouth daily.    ° SUMAtriptan (IMITREX) 100 MG tablet Take 100 mg by mouth as needed for migraine. May repeat in 2 hours if headache persists or recurs.    ° UNABLE TO FIND Take   1 tablet by mouth daily. Med Name: Viviscal     ondansetron (ZOFRAN) 24 MG tablet Take 24 mg by mouth once. (Patient not taking: Reported on 05/21/2021)     No current facility-administered medications for this visit.    Allergies as of 05/21/2021 - Review Complete 05/21/2021  Allergen Reaction Noted   Codeine Nausea And Vomiting 12/18/2018    No family history on file.  Social History   Socioeconomic History   Marital status: Single    Spouse name: Not on file   Number of children: Not on file   Years of education: Not on file   Highest education level: Not on file  Occupational History   Not on file  Tobacco Use   Smoking status: Never   Smokeless tobacco: Never   Vaping Use   Vaping Use: Never used  Substance and Sexual Activity   Alcohol use: Never   Drug use: Never   Sexual activity: Yes    Birth control/protection: Surgical  Other Topics Concern   Not on file  Social History Narrative   Not on file   Social Determinants of Health   Financial Resource Strain: Not on file  Food Insecurity: Not on file  Transportation Needs: Not on file  Physical Activity: Not on file  Stress: Not on file  Social Connections: Not on file    Subjective: Review of Systems  Constitutional:  Negative for chills and fever.  HENT:  Negative for congestion and hearing loss.   Eyes:  Negative for blurred vision and double vision.  Respiratory:  Negative for cough and shortness of breath.   Cardiovascular:  Negative for chest pain and palpitations.  Gastrointestinal:  Positive for abdominal pain and heartburn. Negative for blood in stool, constipation, diarrhea, melena and vomiting.       Bloating  Genitourinary:  Negative for dysuria and urgency.  Musculoskeletal:  Negative for joint pain and myalgias.  Skin:  Negative for itching and rash.  Neurological:  Negative for dizziness and headaches.  Psychiatric/Behavioral:  Negative for depression. The patient is not nervous/anxious.     Objective: BP (!) 139/91    Pulse 93    Temp 97.7 F (36.5 C)    Ht 5\' 1"  (1.549 m)    Wt 120 lb (54.4 kg)    BMI 22.67 kg/m  Physical Exam Constitutional:      Appearance: Normal appearance.  HENT:     Head: Normocephalic and atraumatic.  Eyes:     Extraocular Movements: Extraocular movements intact.     Conjunctiva/sclera: Conjunctivae normal.  Cardiovascular:     Rate and Rhythm: Normal rate and regular rhythm.  Pulmonary:     Effort: Pulmonary effort is normal.     Breath sounds: Normal breath sounds.  Abdominal:     General: Bowel sounds are normal.     Palpations: Abdomen is soft.  Musculoskeletal:        General: No swelling. Normal range of motion.      Cervical back: Normal range of motion and neck supple.  Skin:    General: Skin is warm and dry.     Coloration: Skin is not jaundiced.  Neurological:     General: No focal deficit present.     Mental Status: She is alert and oriented to person, place, and time.  Psychiatric:        Mood and Affect: Mood normal.        Behavior: Behavior normal.     Assessment: *Abdominal  pain *Bloating *Acid reflux  Plan: Will schedule for EGD to evaluate for peptic ulcer disease, esophagitis, gastritis, H. Pylori, duodenitis, or other. Will also evaluate for esophageal stricture, Schatzki's ring, esophageal web or other.   The risks including infection, bleed, or perforation as well as benefits, limitations, alternatives and imponderables have been reviewed with the patient. Potential for esophageal dilation, biopsy, etc. have also been reviewed.  Questions have been answered. All parties agreeable.  May need to consider putting back on PPI therapy pending endoscopic findings  Right upper quadrant ultrasound negative for cholelithiasis or cholecystitis.  Continue on digestive enzymes  Further recommendations to follow  05/21/2021 3:37 PM   Disclaimer: This note was dictated with voice recognition software. Similar sounding words can inadvertently be transcribed and may not be corrected upon review.

## 2021-06-03 ENCOUNTER — Ambulatory Visit (HOSPITAL_COMMUNITY): Payer: Medicare Other | Admitting: Anesthesiology

## 2021-06-03 ENCOUNTER — Other Ambulatory Visit: Payer: Self-pay

## 2021-06-03 ENCOUNTER — Encounter (HOSPITAL_COMMUNITY)
Admission: RE | Disposition: A | Discharge status: Home / Self Care | Payer: Self-pay | Source: Ambulatory Visit | Attending: Internal Medicine

## 2021-06-03 ENCOUNTER — Ambulatory Visit (HOSPITAL_COMMUNITY)
Admission: RE | Admit: 2021-06-03 | Discharge: 2021-06-03 | Disposition: A | Discharge status: Home / Self Care | Payer: Medicare Other | Source: Ambulatory Visit | Attending: Internal Medicine | Admitting: Internal Medicine

## 2021-06-03 ENCOUNTER — Encounter (HOSPITAL_COMMUNITY): Payer: Self-pay

## 2021-06-03 DIAGNOSIS — Z791 Long term (current) use of non-steroidal anti-inflammatories (NSAID): Secondary | ICD-10-CM | POA: Diagnosis not present

## 2021-06-03 DIAGNOSIS — K222 Esophageal obstruction: Secondary | ICD-10-CM | POA: Diagnosis not present

## 2021-06-03 DIAGNOSIS — K319 Disease of stomach and duodenum, unspecified: Secondary | ICD-10-CM | POA: Diagnosis not present

## 2021-06-03 DIAGNOSIS — R1013 Epigastric pain: Secondary | ICD-10-CM | POA: Diagnosis present

## 2021-06-03 DIAGNOSIS — K2970 Gastritis, unspecified, without bleeding: Secondary | ICD-10-CM | POA: Diagnosis not present

## 2021-06-03 DIAGNOSIS — K2100 Gastro-esophageal reflux disease with esophagitis, without bleeding: Secondary | ICD-10-CM

## 2021-06-03 HISTORY — PX: ESOPHAGOGASTRODUODENOSCOPY (EGD) WITH PROPOFOL: SHX5813

## 2021-06-03 HISTORY — PX: BIOPSY: SHX5522

## 2021-06-03 SURGERY — ESOPHAGOGASTRODUODENOSCOPY (EGD) WITH PROPOFOL
Anesthesia: General

## 2021-06-03 MED ORDER — OMEPRAZOLE 40 MG PO CPDR: 40.0000 mg | DELAYED_RELEASE_CAPSULE | Freq: Two times a day (BID) | ORAL | 5 refills | Status: DC

## 2021-06-03 MED ORDER — LACTATED RINGERS IV SOLN: INTRAVENOUS | Status: DC

## 2021-06-03 MED ORDER — LIDOCAINE HCL (CARDIAC) PF 100 MG/5ML IV SOSY
PREFILLED_SYRINGE | INTRAVENOUS | Status: DC | PRN
  Administered 2021-06-03: 50 mg via INTRAVENOUS

## 2021-06-03 MED ORDER — PROPOFOL 10 MG/ML IV BOLUS
INTRAVENOUS | Status: DC | PRN
Start: 2021-06-03 — End: 2021-06-03
  Administered 2021-06-03: 40 mg via INTRAVENOUS
  Administered 2021-06-03: 100 mg via INTRAVENOUS

## 2021-06-03 NOTE — Discharge Instructions (Addendum)
EGD Discharge instructions Please read the instructions outlined below and refer to this sheet in the next few weeks. These discharge instructions provide you with general information on caring for yourself after you leave the hospital. Your doctor may also give you specific instructions. While your treatment has been planned according to the most current medical practices available, unavoidable complications occasionally occur. If you have any problems or questions after discharge, please call your doctor. ACTIVITY You may resume your regular activity but move at a slower pace for the next 24 hours.  Take frequent rest periods for the next 24 hours.  Walking will help expel (get rid of) the air and reduce the bloated feeling in your abdomen.  No driving for 24 hours (because of the anesthesia (medicine) used during the test).  You may shower.  Do not sign any important legal documents or operate any machinery for 24 hours (because of the anesthesia used during the test).  NUTRITION Drink plenty of fluids.  You may resume your normal diet.  Begin with a light meal and progress to your normal diet.  Avoid alcoholic beverages for 24 hours or as instructed by your caregiver.  MEDICATIONS You may resume your normal medications unless your caregiver tells you otherwise.  WHAT YOU CAN EXPECT TODAY You may experience abdominal discomfort such as a feeling of fullness or gas pains.  FOLLOW-UP Your doctor will discuss the results of your test with you.  SEEK IMMEDIATE MEDICAL ATTENTION IF ANY OF THE FOLLOWING OCCUR: Excessive nausea (feeling sick to your stomach) and/or vomiting.  Severe abdominal pain and distention (swelling).  Trouble swallowing.  Temperature over 101 F (37.8 C).  Rectal bleeding or vomiting of blood.   Your EGD revealed moderate amount inflammation in your stomach including 3 superficial ulcer.  I took biopsies of this to rule out infection with a bacteria called H.  pylori.  Await pathology results, my office will contact you.  You also have evidence of reflux esophagitis.  Recommend starting omeprazole twice daily for the next 12 weeks at which point you can go down to once daily.  Avoid NSAIDs as best as you can.  Follow-up with GI in 4 months.     OFFICE NOTIFIED (SUE) AND PATIENT WILL BE PLACED ON RECALL LIST FOR FOLLOW UP APPOINTMENT, PREFERABLY IN AFTERNOON.  TO BE NOTIFIED VIA LETTER OR CALL.  I hope you have a great rest of your week!  Hennie Duos. Marletta Lor, D.O. Gastroenterology and Hepatology [facility]

## 2021-06-03 NOTE — Anesthesia Postprocedure Evaluation (Signed)
Anesthesia Post Note  Patient: Angela Mckay  Procedure(s) Performed: ESOPHAGOGASTRODUODENOSCOPY (EGD) WITH PROPOFOL BIOPSY  Patient location during evaluation: Phase II Anesthesia Type: General Level of consciousness: awake and alert and oriented Pain management: pain level controlled Vital Signs Assessment: post-procedure vital signs reviewed and stable Respiratory status: spontaneous breathing, nonlabored ventilation and respiratory function stable Cardiovascular status: blood pressure returned to baseline and stable Postop Assessment: no apparent nausea or vomiting Anesthetic complications: no   No notable events documented.   Last Vitals:  Vitals:   06/03/21 0926 06/03/21 0928  BP: 107/66 97/60  Pulse: 85 85  Resp: 16 18  Temp: 36.5 C   SpO2: 97% 98%    Last Pain:  Vitals:   06/03/21 0928  TempSrc:   PainSc: 0-No pain                 Tymere Depuy C Lizet Kelso

## 2021-06-03 NOTE — Interval H&P Note (Signed)
History and Physical Interval Note:  06/03/2021 9:13 AM  Angela Mckay  has presented today for surgery, with the diagnosis of nausea, GERD, epigastric pain.  The various methods of treatment have been discussed with the patient and family. After consideration of risks, benefits and other options for treatment, the patient has consented to  Procedure(s) with comments: ESOPHAGOGASTRODUODENOSCOPY (EGD) WITH PROPOFOL (N/A) - 9:30am as a surgical intervention.  The patient's history has been reviewed, patient examined, no change in status, stable for surgery.  I have reviewed the patient's chart and labs.  Questions were answered to the patient's satisfaction.     Lanelle Bal

## 2021-06-03 NOTE — Anesthesia Preprocedure Evaluation (Signed)
Anesthesia Evaluation  Patient identified by MRN, date of birth, ID band Patient awake    Reviewed: Allergy & Precautions, NPO status , Patient's Chart, lab work & pertinent test results  History of Anesthesia Complications Negative for: history of anesthetic complications  Airway Mallampati: II  TM Distance: >3 FB Neck ROM: Full   Comment: Nasal bone fx Dental no notable dental hx. (+) Dental Advisory Given, Teeth Intact   Pulmonary neg pulmonary ROS,    Pulmonary exam normal breath sounds clear to auscultation       Cardiovascular Exercise Tolerance: Good negative cardio ROS Normal cardiovascular exam Rhythm:Regular Rate:Normal     Neuro/Psych  Headaches, negative psych ROS   GI/Hepatic negative GI ROS,   Endo/Other  Hypothyroidism   Renal/GU      Musculoskeletal  (+) Arthritis ,   Abdominal   Peds  Hematology   Anesthesia Other Findings Nasal bone fx  Reproductive/Obstetrics                             Anesthesia Physical  Anesthesia Plan  ASA: 2  Anesthesia Plan: General   Post-op Pain Management: Minimal or no pain anticipated   Induction:   PONV Risk Score and Plan: TIVA  Airway Management Planned: Nasal Cannula and Natural Airway  Additional Equipment:   Intra-op Plan:   Post-operative Plan:   Informed Consent: I have reviewed the patients History and Physical, chart, labs and discussed the procedure including the risks, benefits and alternatives for the proposed anesthesia with the patient or authorized representative who has indicated his/her understanding and acceptance.     Dental advisory given  Plan Discussed with: CRNA and Surgeon  Anesthesia Plan Comments:         Anesthesia Quick Evaluation

## 2021-06-03 NOTE — Op Note (Signed)
[facility] Patient Name: Angela Mckay Procedure Date: 06/03/2021 9:09 AM MRN: [id number] Date of Birth: 02-14-1951 Attending MD: Elon Alas. Abbey Chatters DO CSN: [id number] Age: 71 Admit Type: Outpatient Procedure:                Upper GI endoscopy Indications:              Epigastric abdominal pain, Abdominal pain in the                            right upper quadrant, Heartburn Providers:                Elon Alas. Abbey Chatters, DO, Tammy Vaught, RN, Dereck Leep, Technician Referring MD:              Medicines:                See the Anesthesia note for documentation of the                            administered medications Complications:            No immediate complications. Estimated Blood Loss:     Estimated blood loss was minimal. Procedure:                Pre-Anesthesia Assessment:                           - The anesthesia plan was to use monitored                            anesthesia care (MAC).                           After obtaining informed consent, the endoscope was                            passed under direct vision. Throughout the                            procedure, the patient's blood pressure, pulse, and                            oxygen saturations were monitored continuously. The                            [id number]-H190 ([id number]) scope was introduced through the                            mouth, and advanced to the second part of duodenum.                            The upper GI endoscopy was accomplished without                            difficulty.  The patient tolerated the procedure                            well. Scope In: 9:20:04 AM Scope Out: 9:23:07 AM Total Procedure Duration: 0 hours 3 minutes 3 seconds  Findings:      LA Grade B (one or more mucosal breaks greater than 5 mm, not extending       between the tops of two mucosal folds) esophagitis with no bleeding was       found in the distal esophagus.       Localized moderate inflammation characterized by erythema and shallow       ulcerations was found in the gastric body. Biopsies were taken with a       cold forceps for Helicobacter pylori testing.      The duodenal bulb, first portion of the duodenum and second portion of       the duodenum were normal.      A non-obstructing Schatzki ring was found in the distal esophagus. Impression:               - LA Grade B reflux esophagitis with no bleeding.                           - Gastritis. Biopsied.                           - Normal duodenal bulb, first portion of the                            duodenum and second portion of the duodenum.                           - Non-obstructing Schatzki ring. Moderate Sedation:      Per Anesthesia Care Recommendation:           - Patient has a contact number available for                            emergencies. The signs and symptoms of potential                            delayed complications were discussed with the                            patient. Return to normal activities tomorrow.                            Written discharge instructions were provided to the                            patient.                           - Resume previous diet.                           - Continue present medications.                           -  Await pathology results.                           - Use a proton pump inhibitor PO BID for 12 weeks.                           - No ibuprofen, naproxen, or other non-steroidal                            anti-inflammatory drugs.                           - Return to GI clinic in 4 months. Procedure Code(s):        --- Professional ---                           [phone], Esophagogastroduodenoscopy, flexible,                            transoral; with biopsy, single or multiple Diagnosis Code(s):        --- Professional ---                           K21.00, Gastro-esophageal reflux disease with                             esophagitis, without bleeding                           K29.70, Gastritis, unspecified, without bleeding                           K22.2, Esophageal obstruction                           R10.13, Epigastric pain                           R10.11, Right upper quadrant pain                           R12, Heartburn CPT copyright 2019 American Medical Association. All rights reserved. The codes documented in this report are preliminary and upon coder review may  be revised to meet current compliance requirements. Elon Alas. Abbey Chatters, DO [place] Abbey Chatters, DO 06/03/2021 9:29:41 AM This report has been signed electronically. Number of Addenda: 0

## 2021-06-03 NOTE — Anesthesia Procedure Notes (Signed)
Date/Time: 06/03/2021 9:21 AM Performed by: Julian Reil, CRNA Pre-anesthesia Checklist: Patient identified, Emergency Drugs available, Suction available and Patient being monitored Patient Re-evaluated:Patient Re-evaluated prior to induction Oxygen Delivery Method: Nasal cannula Induction Type: IV induction Placement Confirmation: positive ETCO2

## 2021-06-03 NOTE — Transfer of Care (Signed)
Immediate Anesthesia Transfer of Care Note  Patient: Angela Mckay  Procedure(s) Performed: ESOPHAGOGASTRODUODENOSCOPY (EGD) WITH PROPOFOL BIOPSY  Patient Location: Endoscopy Unit  Anesthesia Type:General  Level of Consciousness: drowsy  Airway & Oxygen Therapy: Patient Spontanous Breathing  Post-op Assessment: Report given to RN and Post -op Vital signs reviewed and stable  Post vital signs: Reviewed and stable  Last Vitals:  Vitals Value Taken Time  BP    Temp    Pulse    Resp    SpO2      Last Pain:  Vitals:   06/03/21 0916  TempSrc:   PainSc: 0-No pain      Patients Stated Pain Goal: 9 (06/03/21 0814)  Complications: No notable events documented.

## 2021-06-04 LAB — SURGICAL PATHOLOGY

## 2021-06-08 ENCOUNTER — Encounter (HOSPITAL_COMMUNITY): Payer: Self-pay | Admitting: Internal Medicine

## 2021-07-13 ENCOUNTER — Telehealth: Payer: Self-pay

## 2021-07-13 NOTE — Telephone Encounter (Signed)
Pt called and LMOVM @ 4:20pm and I returned her call and she states the Omeprazole you put her on has broke her out on her arms, chest and back. Red itchy rash for the past 2 to 3 weeks.(At first she stated for some time, but I got her to narrow it down). I asked her was she still taking the medication and she stated yes. I advised her it will be tomorrow before she hears from you. She states you can or I and leave a message but we can only speak to her after 2:30 pm. Please advise ?

## 2021-07-20 NOTE — Telephone Encounter (Signed)
Pt called again (LMOVM during my lunch) that she was never contacted last week and that her rash is worse and she is itching more. Pt stated she has not stop taking the medication Omeprazole and she is not until she hears from the doctor regarding recommendations.  ?Please call and speak with the pt regarding at least stopping the Omeprazole. ?

## 2021-07-21 NOTE — Telephone Encounter (Signed)
I called and spoke with patient 07/20/2021.  States she has developed a rash from starting her omeprazole.  I recommended that she discontinue this medication and we will monitor for improvement.  She can take Benadryl as needed.  Assuming that her rash improves off the omeprazole, we can consider trying a different PPI as she did apparently tolerate pantoprazole in the past.  She will call in 1 to 2 weeks with update ?

## 2021-07-21 NOTE — Telephone Encounter (Signed)
noted 

## 2021-08-12 NOTE — Telephone Encounter (Signed)
Angela Mckay ?Returned the call of the pt and was advised her rash was gone but she is needing to be put on something everyday because she states she has 2 or 3 ulcers. I advised the pt that Dr. Marletta Lor was out of the  office until next week and she did state se would rather wait for him to return so he can make the decision on what to give her. ?

## 2021-08-12 NOTE — Telephone Encounter (Signed)
noted 

## 2021-08-12 NOTE — Telephone Encounter (Signed)
Noted. I would be glad to start her on something, but we can wait for Dr. Queen Blossom recommendations as she requested.  ?

## 2021-08-20 ENCOUNTER — Telehealth: Payer: Self-pay | Admitting: Internal Medicine

## 2021-08-20 MED ORDER — PANTOPRAZOLE SODIUM 40 MG PO TBEC
40.0000 mg | DELAYED_RELEASE_TABLET | Freq: Two times a day (BID) | ORAL | 5 refills | Status: DC
Start: 2021-08-20 — End: 2021-11-19

## 2021-08-20 NOTE — Telephone Encounter (Signed)
Pantoprazole 40 mg twice daily sent to pharmacy.  Patient apparently tolerated this in the past.  Please tell her that if she has recurrence of her rash she will need to stop this medication immediately.  Thank you ?

## 2021-08-21 NOTE — Telephone Encounter (Signed)
Phoned and LMOVM for the pt regarding Rx being sent to her pharmacy and instructions if rash occurs ?

## 2021-09-08 ENCOUNTER — Other Ambulatory Visit: Payer: Self-pay | Admitting: Internal Medicine

## 2021-09-14 ENCOUNTER — Encounter: Payer: Self-pay | Admitting: Internal Medicine

## 2021-10-29 ENCOUNTER — Ambulatory Visit: Payer: Medicare Other | Admitting: Internal Medicine

## 2021-11-19 ENCOUNTER — Ambulatory Visit (INDEPENDENT_AMBULATORY_CARE_PROVIDER_SITE_OTHER): Payer: Medicare Other | Admitting: Internal Medicine

## 2021-11-19 ENCOUNTER — Encounter: Payer: Self-pay | Admitting: Internal Medicine

## 2021-11-19 VITALS — BP 131/87 | HR 76 | Temp 97.3°F | Ht 61.0 in | Wt 130.4 lb

## 2021-11-19 DIAGNOSIS — R140 Abdominal distension (gaseous): Secondary | ICD-10-CM

## 2021-11-19 DIAGNOSIS — R1013 Epigastric pain: Secondary | ICD-10-CM

## 2021-11-19 DIAGNOSIS — K219 Gastro-esophageal reflux disease without esophagitis: Secondary | ICD-10-CM

## 2021-11-19 DIAGNOSIS — G8929 Other chronic pain: Secondary | ICD-10-CM | POA: Diagnosis not present

## 2021-11-19 MED ORDER — PANTOPRAZOLE SODIUM 40 MG PO TBEC: 40.0000 mg | DELAYED_RELEASE_TABLET | Freq: Two times a day (BID) | ORAL | 11 refills | Status: DC

## 2021-11-19 NOTE — Progress Notes (Signed)
Referring Provider: Janean Sark Primary Care Physician:  Janean Sark, NP Primary GI:  Dr. Marletta Lor  Chief Complaint  Patient presents with   Follow-up    Follow up on GERD--pt states it is doing better    HPI:   Angela Mckay is a 71 y.o. female who presents to the today for follow-up visit.  Had a colonoscopy 10/20/2020 for surveillance purposes which was relatively unremarkable besides diverticulosis.  She states approximately 1 to 2 weeks after her procedure she had development of abdominal pain.  Primarily epigastric but some lower abdominal pain as well.  Is also noted bloating and gas.  No change in her bowel movements.  No melena hematochezia.    Last seen in January 2023.  Was having worsening reflux.  EGD Performed 06/03/2021 which showed LA grade B esophagitis, gastritis.  Gastric biopsies negative for H. pylori.  Was placed on omeprazole but developed a rash.  Switch to pantoprazole 40 mg twice daily which she has been tolerating well.  Also with history of abdominal pain.  Underwent right upper quadrant ultrasound which was negative for cholelithiasis and cholecystitis.  Today, states she is doing much better.  Reflux symptoms well controlled.  No dysphagia odynophagia.  No epigastric or chest pain.  Recently had cataract surgery.   Past Medical History:  Diagnosis Date   Arthritis    Headache    Hypothyroidism     Past Surgical History:  Procedure Laterality Date   ABDOMINAL HYSTERECTOMY     BIOPSY  06/03/2021   Procedure: BIOPSY;  Surgeon: Lanelle Bal, DO;  Location: [facility];  Service: Endoscopy;;   BONE EXOSTOSIS EXCISION Right 12/27/2018   Procedure: REMOVAL EXOSTOSIS RIGHT FOOT;  Surgeon: Erskine Emery, DPM;  Location: [facility];  Service: Podiatry;  Laterality: Right;   COLONOSCOPY WITH PROPOFOL N/A 10/20/2020   Procedure: COLONOSCOPY WITH PROPOFOL;  Surgeon: Lanelle Bal, DO;  Location: [facility];  Service: Endoscopy;   Laterality: N/A;  ASA II / 9:30   DIAGNOSTIC LAPAROSCOPY     ESOPHAGOGASTRODUODENOSCOPY (EGD) WITH PROPOFOL N/A 06/03/2021   Procedure: ESOPHAGOGASTRODUODENOSCOPY (EGD) WITH PROPOFOL;  Surgeon: Lanelle Bal, DO;  Location: [facility];  Service: Endoscopy;  Laterality: N/A;  9:30am    Current Outpatient Medications  Medication Sig Dispense Refill   aspirin-acetaminophen-caffeine (EXCEDRIN MIGRAINE) 250-250-65 MG tablet Take 1 tablet by mouth every 6 (six) hours as needed for headache.     b complex vitamins tablet Take 1 tablet by mouth daily.     Biotin (BIOTIN 5000) 5 MG CAPS Take by mouth.     Boswellia-Glucosamine-Vit D (OSTEO BI-FLEX ONE PER DAY) TABS Take 1 tablet by mouth daily.     Cholecalciferol (VITAMIN D3) 125 MCG (5000 UT) CAPS Take 5,000 Units by mouth daily.     Digestive Enzymes (DIGESTIVE ENZYME PO) Take 1 capsule by mouth daily.     fexofenadine (ALLEGRA) 180 MG tablet Take 180 mg by mouth daily as needed for allergies or rhinitis.     Misc Natural Products (NEURIVA PO) Take 1 capsule by mouth daily.     Multiple Vitamins-Minerals (MULTIVITAMIN WITH MINERALS) tablet Take 1 tablet by mouth daily.     OMEGA 3-6-9 FATTY ACIDS PO Take 1 capsule by mouth daily.     pantoprazole (PROTONIX) 40 MG tablet Take 1 tablet (40 mg total) by mouth 2 (two) times daily. 60 tablet 5   SUMAtriptan (IMITREX) 100 MG tablet Take 100 mg by mouth as  needed for migraine. May repeat in 2 hours if headache persists or recurs.     EUTHYROX 50 MCG tablet Take 50 mcg by mouth daily before breakfast.  (Patient not taking: Reported on 11/19/2021)     ibuprofen (ADVIL) 200 MG tablet Take 400 mg by mouth every 6 (six) hours as needed for headache or moderate pain. (Patient not taking: Reported on 11/19/2021)     naproxen sodium (ALEVE) 220 MG tablet Take 220 mg by mouth 2 (two) times daily as needed (pain). (Patient not taking: Reported on 11/19/2021)     Probiotic Product (PROBIOTIC PO) Take 1 tablet by  mouth daily. (Patient not taking: Reported on 11/19/2021)     Pseudoephedrine-Ibuprofen (ADVIL COLD/SINUS PO) Take 1 tablet by mouth daily as needed (allergies). (Patient not taking: Reported on 11/19/2021)     psyllium (METAMUCIL) 58.6 % packet Take 1 packet by mouth daily. (Patient not taking: Reported on 11/19/2021)     No current facility-administered medications for this visit.    Allergies as of 11/19/2021 - Review Complete 06/03/2021  Allergen Reaction Noted   Omeprazole Rash 11/19/2021   Codeine Nausea And Vomiting 12/18/2018    No family history on file.  Social History   Socioeconomic History   Marital status: Single    Spouse name: Not on file   Number of children: Not on file   Years of education: Not on file   Highest education level: Not on file  Occupational History   Not on file  Tobacco Use   Smoking status: Never   Smokeless tobacco: Never  Vaping Use   Vaping Use: Never used  Substance and Sexual Activity   Alcohol use: Never   Drug use: Never   Sexual activity: Yes    Birth control/protection: Surgical  Other Topics Concern   Not on file  Social History Narrative   Not on file   Social Determinants of Health   Financial Resource Strain: Not on file  Food Insecurity: Not on file  Transportation Needs: Not on file  Physical Activity: Not on file  Stress: Not on file  Social Connections: Not on file    Subjective: Review of Systems  Constitutional:  Negative for chills and fever.  HENT:  Negative for congestion and hearing loss.   Eyes:  Negative for blurred vision and double vision.  Respiratory:  Negative for cough and shortness of breath.   Cardiovascular:  Negative for chest pain and palpitations.  Gastrointestinal:  Positive for abdominal pain and heartburn. Negative for blood in stool, constipation, diarrhea, melena and vomiting.       Bloating  Genitourinary:  Negative for dysuria and urgency.  Musculoskeletal:  Negative for joint pain  and myalgias.  Skin:  Negative for itching and rash.  Neurological:  Negative for dizziness and headaches.  Psychiatric/Behavioral:  Negative for depression. The patient is not nervous/anxious.      Objective: BP 131/87   Pulse 76   Temp (!) 97.3 F (36.3 C)   Ht 5\' 1"  (1.549 m)   Wt 130 lb 6.4 oz (59.1 kg)   BMI 24.64 kg/m  Physical Exam Constitutional:      Appearance: Normal appearance.  HENT:     Head: Normocephalic and atraumatic.  Eyes:     Extraocular Movements: Extraocular movements intact.     Conjunctiva/sclera: Conjunctivae normal.  Cardiovascular:     Rate and Rhythm: Normal rate and regular rhythm.  Pulmonary:     Effort: Pulmonary effort is normal.  Breath sounds: Normal breath sounds.  Abdominal:     General: Bowel sounds are normal.     Palpations: Abdomen is soft.  Musculoskeletal:        General: No swelling. Normal range of motion.     Cervical back: Normal range of motion and neck supple.  Skin:    General: Skin is warm and dry.     Coloration: Skin is not jaundiced.  Neurological:     General: No focal deficit present.     Mental Status: She is alert and oriented to person, place, and time.  Psychiatric:        Mood and Affect: Mood normal.        Behavior: Behavior normal.      Assessment: *Chronic GERD much improved *Abdominal pain *Bloating   Plan: Patient appears much improved today on twice daily PPI.  Okay to decrease to once daily and see how she does.  Counseled on avoiding NSAIDs.  We will print off home practices to decrease reflux symptoms.  Abdominal pain improved.  Continue to monitor.  Colonoscopy recall 2027.  Follow-up in 6 months  11/19/2021 2:59 PM   Disclaimer: This note was dictated with voice recognition software. Similar sounding words can inadvertently be transcribed and may not be corrected upon review.

## 2021-11-19 NOTE — Patient Instructions (Signed)
I am happy to hear that you are feeling better.  Continue on pantoprazole.  You can decrease dose to once daily and see how you do.  Avoid NSAIDs as best as you can.  Follow-up with GI in 6 months.  It was very nice seeing you again today.  Dr. Marletta Lor  Lifestyle and home remedies TO MANAGE REFLUX/HEARTBURN    You may eliminate or reduce the frequency of heartburn by making the following lifestyle changes:   Control your weight. Being overweight is a major risk factor for heartburn and GERD. Excess pounds put pressure on your abdomen, pushing up your stomach and causing acid to back up into your esophagus.    Eat smaller meals. 4 TO 6 MEALS A DAY. This reduces pressure on the lower esophageal sphincter, helping to prevent the valve from opening and acid from washing back into your esophagus.     Loosen your belt. Clothes that fit tightly around your waist put pressure on your abdomen and the lower esophageal sphincter.     Eliminate heartburn triggers. Everyone has specific triggers. Common triggers such as fatty or fried foods, spicy food, tomato sauce, carbonated beverages, alcohol, chocolate, mint, garlic, onion, caffeine and nicotine may make heartburn worse.    Avoid stooping or bending. Tying your shoes is OK. Bending over for longer periods to weed your garden isn't, especially soon after eating.    Don't lie down after a meal. Wait at least three to four hours after eating before going to bed, and don't lie down right after eating.

## 2022-01-10 IMAGING — US US ABDOMEN LIMITED
1 series · 14 of 25 positions shown · non-contrast
Comparison: None.

CLINICAL DATA: Abdominal pain

EXAM:
ULTRASOUND ABDOMEN LIMITED RIGHT UPPER QUADRANT

[Series 1: us abdomen limited ruq (liver/gb) · 14 of 43 slices shown]
[im 1/43]
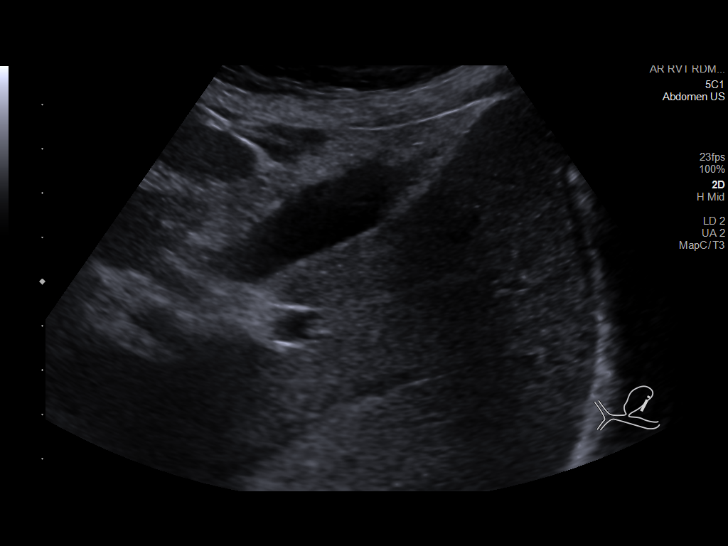
[im 4/43]
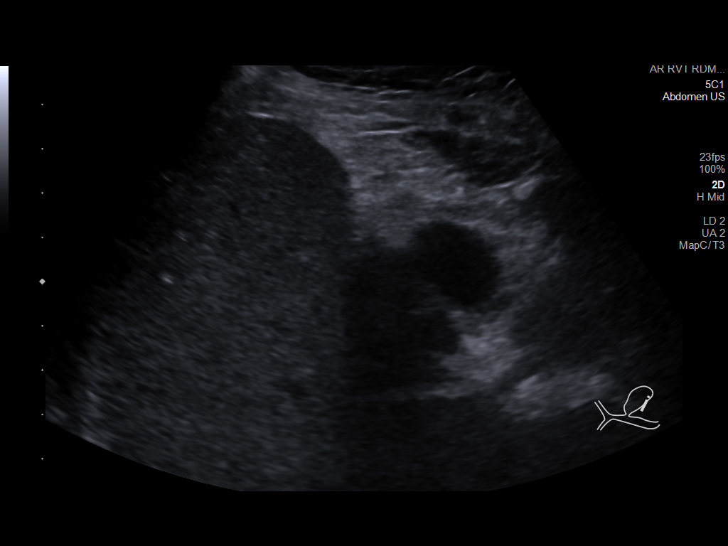
[im 8/43]
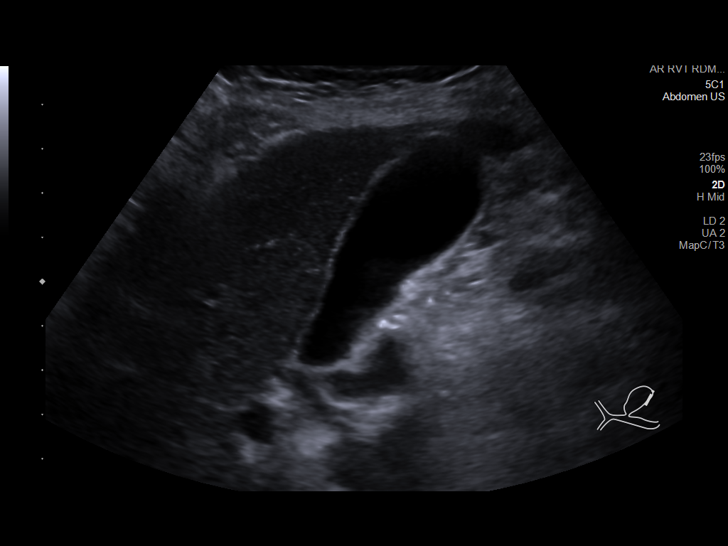
[im 11/43]
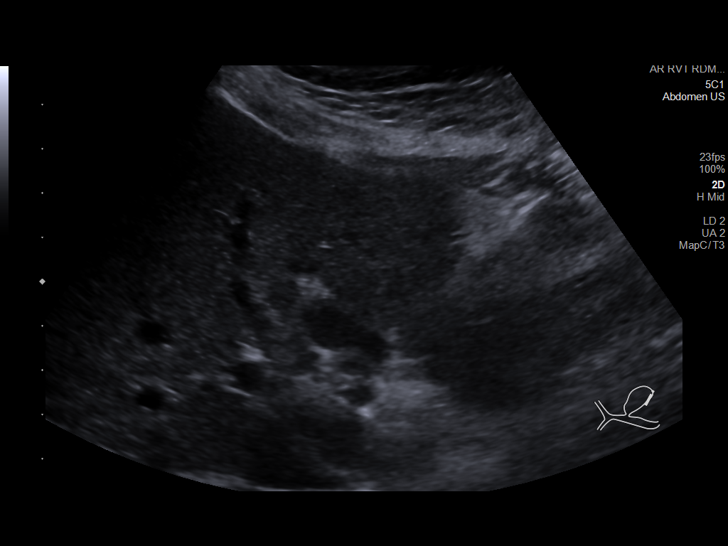
[im 15/43]
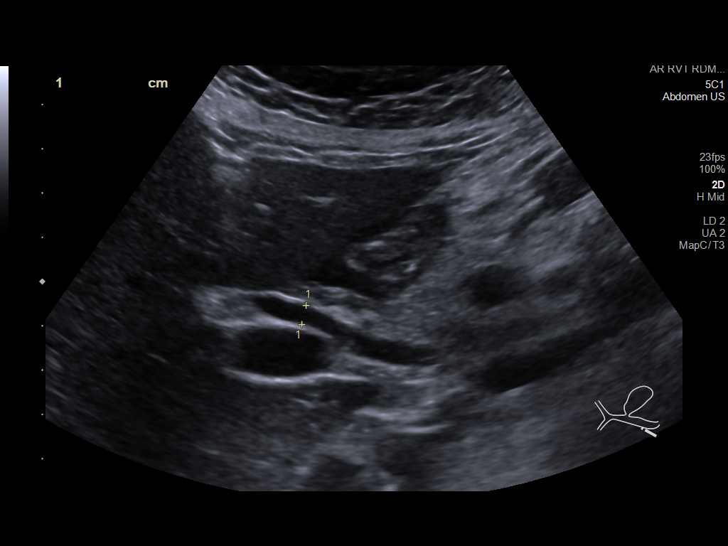
[im 16/43]
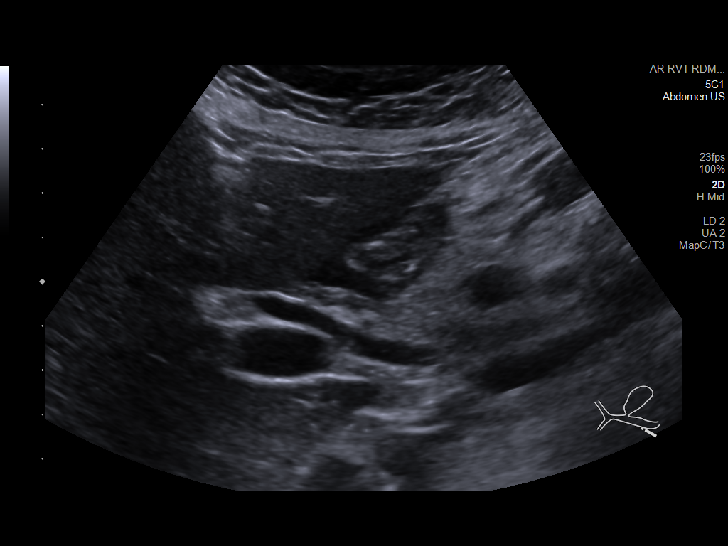
[im 20/43]
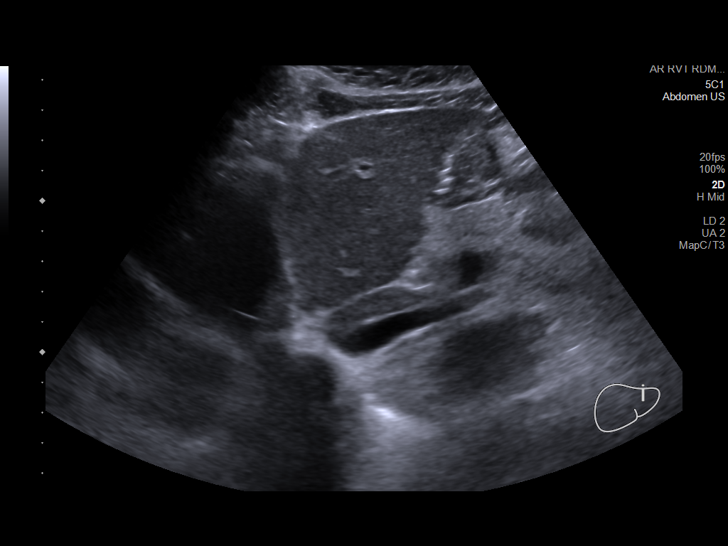
[im 23/43]
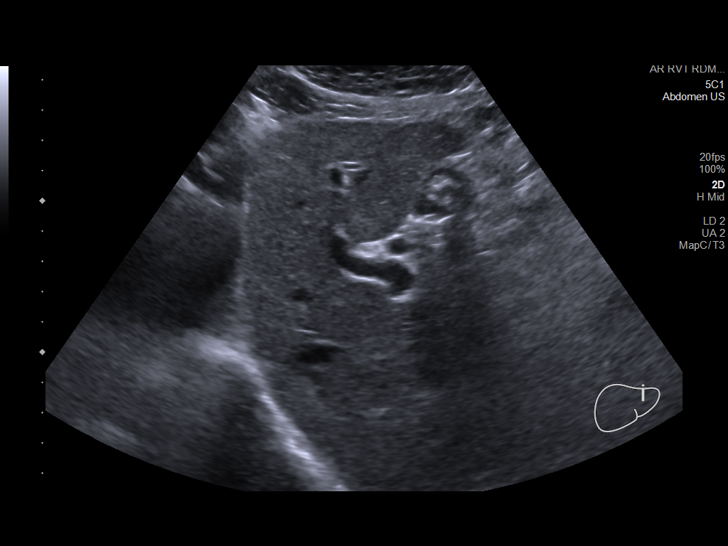
[im 27/43]
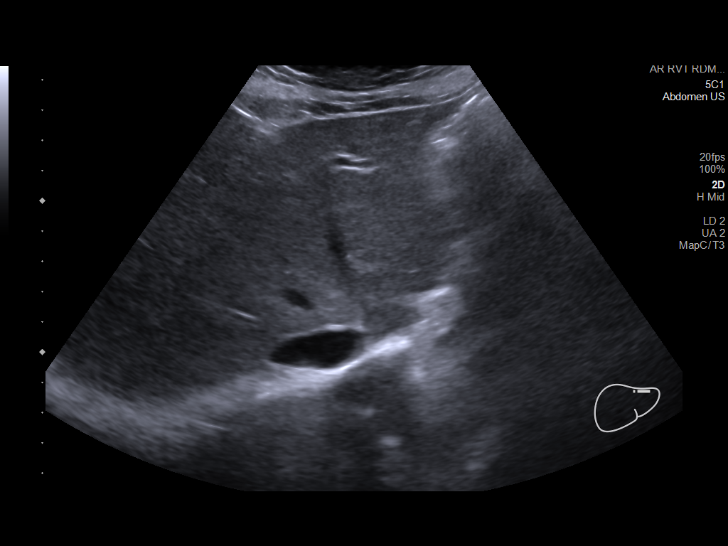
[im 29/43]
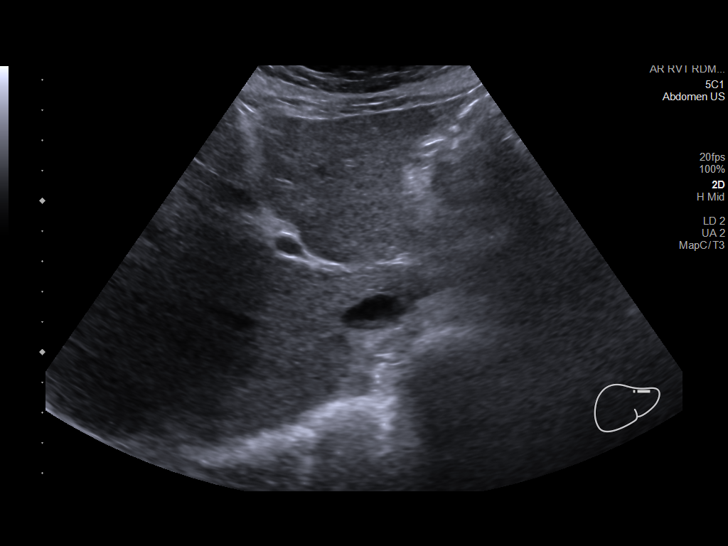
[im 32/43]
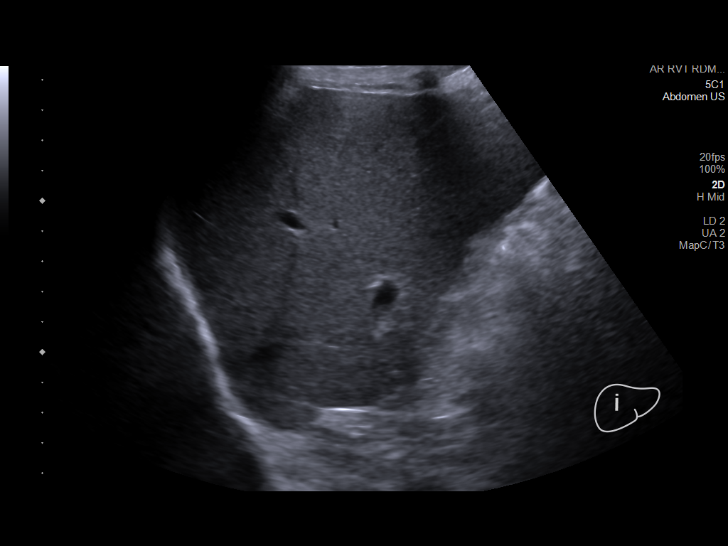
[im 36/43]
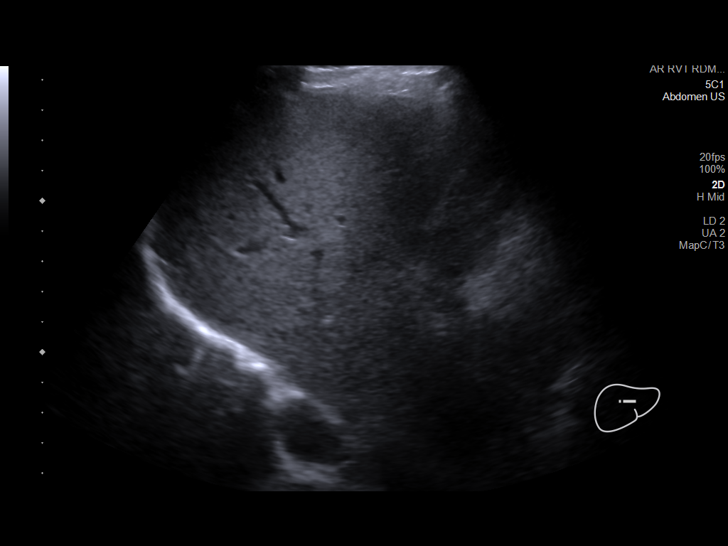
[im 39/43]
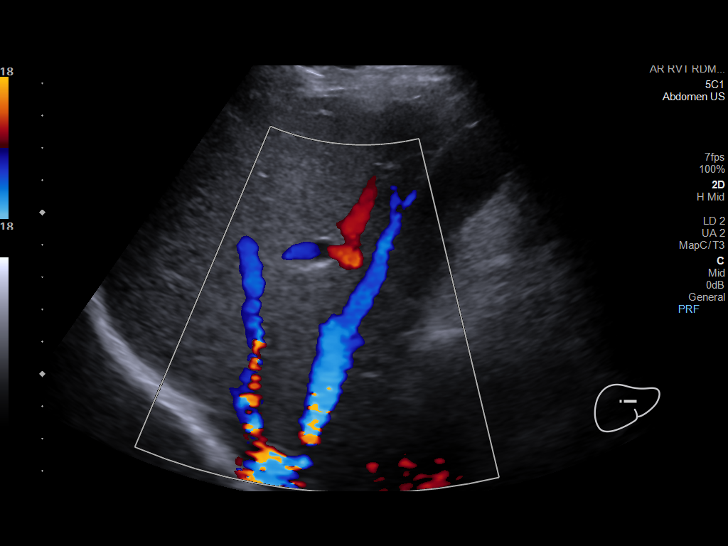
[im 43/43]
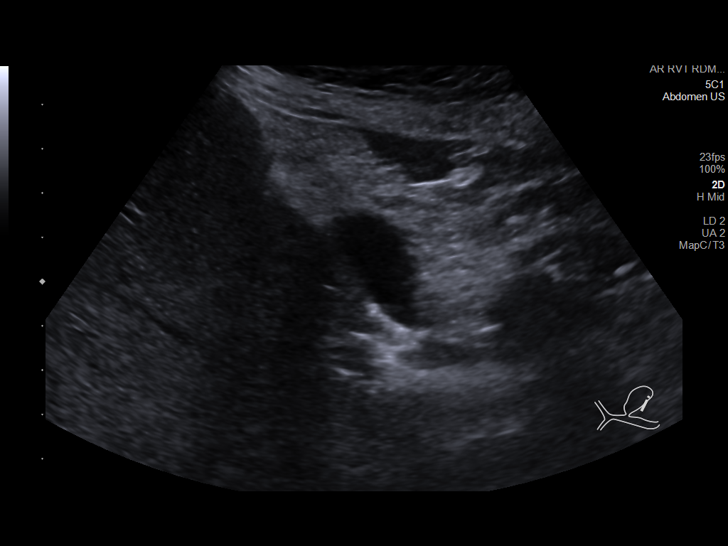

[14 of 25 positions shown; findings below may reference images not displayed]

FINDINGS: Gallbladder:

No gallstones or wall thickening visualized. No sonographic Murphy
sign noted by sonographer.

Common bile duct:

Diameter: 4 mm

Liver:

No focal lesion identified. Within normal limits in parenchymal
echogenicity. Portal vein is patent on color Doppler imaging with
normal direction of blood flow towards the liver.

Other: None.
IMPRESSION: Unremarkable right upper quadrant ultrasound.

## 2022-06-23 ENCOUNTER — Ambulatory Visit (INDEPENDENT_AMBULATORY_CARE_PROVIDER_SITE_OTHER): Payer: Medicare PPO | Admitting: Internal Medicine

## 2022-06-23 ENCOUNTER — Encounter: Payer: Self-pay | Admitting: Internal Medicine

## 2022-06-23 VITALS — BP 130/80 | HR 83 | Temp 98.0°F | Ht 61.0 in | Wt 131.6 lb

## 2022-06-23 DIAGNOSIS — R1013 Epigastric pain: Secondary | ICD-10-CM

## 2022-06-23 DIAGNOSIS — G8929 Other chronic pain: Secondary | ICD-10-CM

## 2022-06-23 DIAGNOSIS — K219 Gastro-esophageal reflux disease without esophagitis: Secondary | ICD-10-CM | POA: Diagnosis not present

## 2022-06-23 DIAGNOSIS — R140 Abdominal distension (gaseous): Secondary | ICD-10-CM

## 2022-06-23 NOTE — Progress Notes (Signed)
Referring Provider: Wendi Maya Primary Care Physician:  Wendi Maya, NP Primary GI:  Dr. Abbey Chatters  Chief Complaint  Patient presents with   Follow-up    Patient here today for a follow up visit on Gerd, abdominal pain and ulcers. She is on Pantoprazole 40 mg once per day, which does control her symptoms.  Patient states her condition has improved.    HPI:   Angela Mckay is a 72 y.o. female who presents to the today for follow-up visit.  Had a colonoscopy 10/20/2020 for surveillance purposes which was relatively unremarkable besides diverticulosis.  She states approximately 1 to 2 weeks after her procedure she had development of abdominal pain.  Primarily epigastric but some lower abdominal pain as well.  Is also noted bloating and gas.  No change in her bowel movements.  No melena hematochezia.    Last seen in January 2023.  Was having worsening reflux.  EGD Performed 06/03/2021 which showed LA grade B esophagitis, gastritis.  Gastric biopsies negative for H. pylori.  Was placed on omeprazole but developed a rash.  Switch to pantoprazole 40 mg twice daily which she has been tolerating well.  Also with history of abdominal pain.  Underwent right upper quadrant ultrasound which was negative for cholelithiasis and cholecystitis.  Today, states she is doing much better.  Reflux symptoms well controlled.  No dysphagia odynophagia.  No epigastric or chest pain.  Recently had cataract surgery.   Past Medical History:  Diagnosis Date   Arthritis    Headache    Hypothyroidism     Past Surgical History:  Procedure Laterality Date   ABDOMINAL HYSTERECTOMY     BIOPSY  06/03/2021   Procedure: BIOPSY;  Surgeon: Eloise Harman, DO;  Location: [facility];  Service: Endoscopy;;   BONE EXOSTOSIS EXCISION Right 12/27/2018   Procedure: REMOVAL EXOSTOSIS RIGHT FOOT;  Surgeon: Tyson Babinski, DPM;  Location: [facility];  Service: Podiatry;  Laterality: Right;   COLONOSCOPY WITH  PROPOFOL N/A 10/20/2020   Procedure: COLONOSCOPY WITH PROPOFOL;  Surgeon: Eloise Harman, DO;  Location: [facility];  Service: Endoscopy;  Laterality: N/A;  ASA II / 9:30   DIAGNOSTIC LAPAROSCOPY     ESOPHAGOGASTRODUODENOSCOPY (EGD) WITH PROPOFOL N/A 06/03/2021   Procedure: ESOPHAGOGASTRODUODENOSCOPY (EGD) WITH PROPOFOL;  Surgeon: Eloise Harman, DO;  Location: [facility];  Service: Endoscopy;  Laterality: N/A;  9:30am    Current Outpatient Medications  Medication Sig Dispense Refill   acetaminophen (TYLENOL) 325 MG tablet Take 650 mg by mouth every 6 (six) hours as needed.     aspirin-acetaminophen-caffeine (EXCEDRIN MIGRAINE) 250-250-65 MG tablet Take 1 tablet by mouth every 6 (six) hours as needed for headache.     b complex vitamins tablet Take 1 tablet by mouth daily.     Biotin (BIOTIN 5000) 5 MG CAPS Take by mouth.     Boswellia-Glucosamine-Vit D (OSTEO BI-FLEX ONE PER DAY) TABS Take 1 tablet by mouth daily.     Cholecalciferol (VITAMIN D3) 125 MCG (5000 UT) CAPS Take 5,000 Units by mouth daily.     Digestive Enzymes (DIGESTIVE ENZYME PO) Take 1 capsule by mouth daily.     fexofenadine (ALLEGRA) 180 MG tablet Take 180 mg by mouth daily as needed for allergies or rhinitis.     levothyroxine (SYNTHROID) 50 MCG tablet Take 50 mcg by mouth daily before breakfast.     Misc Natural Products (NEURIVA PO) Take 1 capsule by mouth daily.     Multiple  Vitamins-Minerals (MULTIVITAMIN WITH MINERALS) tablet Take 1 tablet by mouth daily.     OMEGA 3-6-9 FATTY ACIDS PO Take 1 capsule by mouth daily.     pantoprazole (PROTONIX) 40 MG tablet Take 1 tablet (40 mg total) by mouth 2 (two) times daily. 60 tablet 11   SUMAtriptan (IMITREX) 100 MG tablet Take 100 mg by mouth as needed for migraine. May repeat in 2 hours if headache persists or recurs.     No current facility-administered medications for this visit.    Allergies as of 06/23/2022 - Review Complete 06/23/2022  Allergen Reaction  Noted   Omeprazole Rash 11/19/2021   Codeine Nausea And Vomiting 12/18/2018    History reviewed. No pertinent family history.  Social History   Socioeconomic History   Marital status: Single    Spouse name: Not on file   Number of children: Not on file   Years of education: Not on file   Highest education level: Not on file  Occupational History   Not on file  Tobacco Use   Smoking status: Never   Smokeless tobacco: Never  Vaping Use   Vaping Use: Never used  Substance and Sexual Activity   Alcohol use: Never   Drug use: Never   Sexual activity: Yes    Birth control/protection: Surgical  Other Topics Concern   Not on file  Social History Narrative   Not on file   Social Determinants of Health   Financial Resource Strain: Not on file  Food Insecurity: Not on file  Transportation Needs: Not on file  Physical Activity: Not on file  Stress: Not on file  Social Connections: Not on file    Subjective: Review of Systems  Constitutional:  Negative for chills and fever.  HENT:  Negative for congestion and hearing loss.   Eyes:  Negative for blurred vision and double vision.  Respiratory:  Negative for cough and shortness of breath.   Cardiovascular:  Negative for chest pain and palpitations.  Gastrointestinal:  Positive for abdominal pain and heartburn. Negative for blood in stool, constipation, diarrhea, melena and vomiting.       Bloating  Genitourinary:  Negative for dysuria and urgency.  Musculoskeletal:  Negative for joint pain and myalgias.  Skin:  Negative for itching and rash.  Neurological:  Negative for dizziness and headaches.  Psychiatric/Behavioral:  Negative for depression. The patient is not nervous/anxious.      Objective: BP 130/80 (BP Location: Left Arm, Patient Position: Sitting, Cuff Size: Normal)   Pulse 83   Temp 98 F (36.7 C) (Temporal)   Ht 5' 1"$  (1.549 m)   Wt 131 lb 9.6 oz (59.7 kg)   BMI 24.87 kg/m  Physical Exam Constitutional:       Appearance: Normal appearance.  HENT:     Head: Normocephalic and atraumatic.  Eyes:     Extraocular Movements: Extraocular movements intact.     Conjunctiva/sclera: Conjunctivae normal.  Cardiovascular:     Rate and Rhythm: Normal rate and regular rhythm.  Pulmonary:     Effort: Pulmonary effort is normal.     Breath sounds: Normal breath sounds.  Abdominal:     General: Bowel sounds are normal.     Palpations: Abdomen is soft.  Musculoskeletal:        General: No swelling. Normal range of motion.     Cervical back: Normal range of motion and neck supple.  Skin:    General: Skin is warm and dry.     Coloration:  Skin is not jaundiced.  Neurological:     General: No focal deficit present.     Mental Status: She is alert and oriented to person, place, and time.  Psychiatric:        Mood and Affect: Mood normal.        Behavior: Behavior normal.      Assessment: *Chronic GERD much improved *Abdominal pain *Abdominal bloating   Plan: Patient appears much improved today on twice daily PPI.  Okay to decrease to once daily and see how she does.  Counseled on avoiding NSAIDs.  Continue home practices to decrease reflux symptoms.  Abdominal pain improved.  Continue to monitor.  Colonoscopy recall 2027.  Follow-up in 1 year or sooner if needed.   06/23/2022 1:24 PM   Disclaimer: This note was dictated with voice recognition software. Similar sounding words can inadvertently be transcribed and may not be corrected upon review.

## 2022-06-23 NOTE — Patient Instructions (Signed)
I am happy to hear that you are feeling better.  Continue on pantoprazole.   Avoid NSAIDs as best as you can.  Follow-up with GI in 1 year or sooner if needed.   It was very nice seeing you again today.  Dr. Abbey Chatters

## 2022-12-15 ENCOUNTER — Other Ambulatory Visit: Payer: Self-pay | Admitting: Internal Medicine

## 2022-12-15 MED ORDER — PANTOPRAZOLE SODIUM 40 MG PO TBEC: 40.0000 mg | DELAYED_RELEASE_TABLET | Freq: Two times a day (BID) | ORAL | 3 refills | Status: DC

## 2023-06-23 ENCOUNTER — Inpatient Hospital Stay: Payer: No Typology Code available for payment source | Admitting: Internal Medicine

## 2023-07-15 ENCOUNTER — Ambulatory Visit: Payer: Medicare PPO | Admitting: Internal Medicine

## 2023-07-15 ENCOUNTER — Encounter: Payer: Self-pay | Admitting: Internal Medicine

## 2023-07-15 VITALS — BP 122/84 | HR 81 | Temp 98.6°F | Ht 61.0 in | Wt 122.6 lb

## 2023-07-15 DIAGNOSIS — R140 Abdominal distension (gaseous): Secondary | ICD-10-CM

## 2023-07-15 DIAGNOSIS — R109 Unspecified abdominal pain: Secondary | ICD-10-CM

## 2023-07-15 DIAGNOSIS — G8929 Other chronic pain: Secondary | ICD-10-CM

## 2023-07-15 DIAGNOSIS — K219 Gastro-esophageal reflux disease without esophagitis: Secondary | ICD-10-CM

## 2023-07-15 NOTE — Progress Notes (Signed)
 Referring Provider: Janean Sark Primary Care Physician:  Janean Sark, NP Primary GI:  Dr. Marletta Lor  Chief Complaint  Patient presents with   Follow-up    Pt following up on GERD and abd pain. Pt has no complaints today    HPI:   Angela Mckay is a 73 y.o. female who presents to the today for follow-up visit.  Had a colonoscopy 10/20/2020 for surveillance purposes which was relatively unremarkable besides diverticulosis.    History of chronic GERD: EGD Performed 06/03/2021 which showed LA grade B esophagitis, gastritis.  Gastric biopsies negative for H. pylori.  Was placed on omeprazole but developed a rash.  Switched to pantoprazole 40 mg twice daily which she has been tolerating well. She states some days she will only take once a day.   Also with history of abdominal pain.  Underwent right upper quadrant ultrasound which was negative for cholelithiasis and cholecystitis.  Today, states she is doing much better.  Reflux symptoms well controlled.  No dysphagia odynophagia.  No epigastric or chest pain.     Past Medical History:  Diagnosis Date   Arthritis    Headache    Hypothyroidism     Past Surgical History:  Procedure Laterality Date   ABDOMINAL HYSTERECTOMY     BIOPSY  06/03/2021   Procedure: BIOPSY;  Surgeon: Lanelle Bal, DO;  Location: [facility];  Service: Endoscopy;;   BONE EXOSTOSIS EXCISION Right 12/27/2018   Procedure: REMOVAL EXOSTOSIS RIGHT FOOT;  Surgeon: Erskine Emery, DPM;  Location: [facility];  Service: Podiatry;  Laterality: Right;   COLONOSCOPY WITH PROPOFOL N/A 10/20/2020   Procedure: COLONOSCOPY WITH PROPOFOL;  Surgeon: Lanelle Bal, DO;  Location: [facility];  Service: Endoscopy;  Laterality: N/A;  ASA II / 9:30   DIAGNOSTIC LAPAROSCOPY     ESOPHAGOGASTRODUODENOSCOPY (EGD) WITH PROPOFOL N/A 06/03/2021   Procedure: ESOPHAGOGASTRODUODENOSCOPY (EGD) WITH PROPOFOL;  Surgeon: Lanelle Bal, DO;  Location: [facility];   Service: Endoscopy;  Laterality: N/A;  9:30am    Current Outpatient Medications  Medication Sig Dispense Refill   acetaminophen (TYLENOL) 325 MG tablet Take 650 mg by mouth every 6 (six) hours as needed.     aspirin-acetaminophen-caffeine (EXCEDRIN MIGRAINE) 250-250-65 MG tablet Take 1 tablet by mouth every 6 (six) hours as needed for headache.     b complex vitamins tablet Take 1 tablet by mouth daily.     Biotin (BIOTIN 5000) 5 MG CAPS Take by mouth.     Boswellia-Glucosamine-Vit D (OSTEO BI-FLEX ONE PER DAY) TABS Take 1 tablet by mouth daily.     Cholecalciferol (VITAMIN D3) 125 MCG (5000 UT) CAPS Take 5,000 Units by mouth daily.     Digestive Enzymes (DIGESTIVE ENZYME PO) Take 1 capsule by mouth daily.     fexofenadine (ALLEGRA) 180 MG tablet Take 180 mg by mouth daily as needed for allergies or rhinitis.     levothyroxine (SYNTHROID) 50 MCG tablet Take 50 mcg by mouth daily before breakfast.     Misc Natural Products (NEURIVA PO) Take 1 capsule by mouth daily.     Multiple Vitamins-Minerals (MULTIVITAMIN WITH MINERALS) tablet Take 1 tablet by mouth daily.     OMEGA 3-6-9 FATTY ACIDS PO Take 1 capsule by mouth daily.     pantoprazole (PROTONIX) 40 MG tablet Take 1 tablet (40 mg total) by mouth 2 (two) times daily. 180 tablet 3   SUMAtriptan (IMITREX) 100 MG tablet Take 100 mg by mouth as needed for  migraine. May repeat in 2 hours if headache persists or recurs.     No current facility-administered medications for this visit.    Allergies as of 07/15/2023 - Review Complete 07/15/2023  Allergen Reaction Noted   Omeprazole Rash 11/19/2021   Codeine Nausea And Vomiting 12/18/2018    No family history on file.  Social History   Socioeconomic History   Marital status: Single    Spouse name: Not on file   Number of children: Not on file   Years of education: Not on file   Highest education level: Not on file  Occupational History   Not on file  Tobacco Use   Smoking status:  Never   Smokeless tobacco: Never  Vaping Use   Vaping status: Never Used  Substance and Sexual Activity   Alcohol use: Never   Drug use: Never   Sexual activity: Yes    Birth control/protection: Surgical  Other Topics Concern   Not on file  Social History Narrative   Not on file   Social Drivers of Health   Financial Resource Strain: Not on file  Food Insecurity: Not on file  Transportation Needs: Not on file  Physical Activity: Not on file  Stress: Not on file  Social Connections: Not on file    Subjective: Review of Systems  Constitutional:  Negative for chills and fever.  HENT:  Negative for congestion and hearing loss.   Eyes:  Negative for blurred vision and double vision.  Respiratory:  Negative for cough and shortness of breath.   Cardiovascular:  Negative for chest pain and palpitations.  Gastrointestinal:  Positive for abdominal pain and heartburn. Negative for blood in stool, constipation, diarrhea, melena and vomiting.       Bloating  Genitourinary:  Negative for dysuria and urgency.  Musculoskeletal:  Negative for joint pain and myalgias.  Skin:  Negative for itching and rash.  Neurological:  Negative for dizziness and headaches.  Psychiatric/Behavioral:  Negative for depression. The patient is not nervous/anxious.      Objective: BP 122/84   Pulse 81   Temp 98.6 F (37 C)   Ht 5\' 1"  (1.549 m)   Wt 122 lb 9.6 oz (55.6 kg)   BMI 23.17 kg/m  Physical Exam Constitutional:      Appearance: Normal appearance.  HENT:     Head: Normocephalic and atraumatic.  Eyes:     Extraocular Movements: Extraocular movements intact.     Conjunctiva/sclera: Conjunctivae normal.  Cardiovascular:     Rate and Rhythm: Normal rate and regular rhythm.  Pulmonary:     Effort: Pulmonary effort is normal.     Breath sounds: Normal breath sounds.  Abdominal:     General: Bowel sounds are normal.     Palpations: Abdomen is soft.  Musculoskeletal:        General: No  swelling. Normal range of motion.     Cervical back: Normal range of motion and neck supple.  Skin:    General: Skin is warm and dry.     Coloration: Skin is not jaundiced.  Neurological:     General: No focal deficit present.     Mental Status: She is alert and oriented to person, place, and time.  Psychiatric:        Mood and Affect: Mood normal.        Behavior: Behavior normal.      Assessment: *Chronic GERD much improved *Abdominal pain *Abdominal bloating   Plan: Patient appears much improved today  on pantoprazole.   Counseled on avoiding NSAIDs.  Continue home practices to decrease reflux symptoms.  Abdominal pain improved.  Continue to monitor.  Colonoscopy recall 2027.  Follow-up in 1 year or sooner if needed.   07/15/2023 10:01 AM   Disclaimer: This note was dictated with voice recognition software. Similar sounding words can inadvertently be transcribed and may not be corrected upon review.

## 2023-07-15 NOTE — Patient Instructions (Signed)
  I am happy to hear that you are doing well.  Continue on pantoprazole.   Avoid NSAIDs as best as you can.  Follow-up with GI in 1 year or sooner if needed.   It was very nice seeing you again today. Give Dianne my best.   Dr. Marletta Lor

## 2023-12-24 ENCOUNTER — Other Ambulatory Visit: Payer: Self-pay | Admitting: Internal Medicine
